# Patient Record
Sex: Female | Born: 1940 | Race: Black or African American | Hispanic: No | Marital: Single | Smoking: Former smoker
Health system: Southern US, Community
[De-identification: ages and names within clinical notes are randomized; demographics above are authoritative.]

## PROBLEM LIST (undated history)

## (undated) DIAGNOSIS — Z87891 Personal history of nicotine dependence: Secondary | ICD-10-CM

## (undated) DIAGNOSIS — E785 Hyperlipidemia, unspecified: Secondary | ICD-10-CM

## (undated) DIAGNOSIS — I5022 Chronic systolic (congestive) heart failure: Secondary | ICD-10-CM

## (undated) DIAGNOSIS — I2510 Atherosclerotic heart disease of native coronary artery without angina pectoris: Secondary | ICD-10-CM

## (undated) DIAGNOSIS — I10 Essential (primary) hypertension: Secondary | ICD-10-CM

## (undated) DIAGNOSIS — Z720 Tobacco use: Secondary | ICD-10-CM

## (undated) HISTORY — DX: Personal history of nicotine dependence: Z87.891

## (undated) HISTORY — DX: Hyperlipidemia, unspecified: E78.5

## (undated) HISTORY — DX: Chronic systolic (congestive) heart failure: I50.22

## (undated) HISTORY — DX: Atherosclerotic heart disease of native coronary artery without angina pectoris: I25.10

## (undated) HISTORY — DX: Essential (primary) hypertension: I10

---

## 1995-10-29 HISTORY — PX: ABDOMINAL HYSTERECTOMY: SHX81

## 2014-06-28 HISTORY — PX: CORONARY ANGIOPLASTY: SHX604

## 2014-06-28 HISTORY — PX: CARDIAC CATHETERIZATION: SHX172

## 2014-07-11 ENCOUNTER — Encounter (HOSPITAL_COMMUNITY)
Admission: EM | Disposition: A | Discharge status: Home / Self Care | Payer: Medicare Other | Attending: Cardiovascular Disease

## 2014-07-11 ENCOUNTER — Inpatient Hospital Stay (HOSPITAL_COMMUNITY)
Admission: EM | Admit: 2014-07-11 | Discharge: 2014-07-14 | DRG: 247 | Disposition: A | Discharge status: Home / Self Care | Payer: Medicare Other | Source: Intra-hospital | Attending: Cardiovascular Disease | Admitting: Cardiovascular Disease

## 2014-07-11 ENCOUNTER — Emergency Department: Payer: Self-pay | Admitting: Emergency Medicine

## 2014-07-11 ENCOUNTER — Encounter (HOSPITAL_COMMUNITY): Payer: Self-pay | Admitting: Physician Assistant

## 2014-07-11 DIAGNOSIS — I2582 Chronic total occlusion of coronary artery: Secondary | ICD-10-CM | POA: Diagnosis present

## 2014-07-11 DIAGNOSIS — F17200 Nicotine dependence, unspecified, uncomplicated: Secondary | ICD-10-CM | POA: Diagnosis present

## 2014-07-11 DIAGNOSIS — I472 Ventricular tachycardia, unspecified: Secondary | ICD-10-CM | POA: Diagnosis not present

## 2014-07-11 DIAGNOSIS — I2119 ST elevation (STEMI) myocardial infarction involving other coronary artery of inferior wall: Secondary | ICD-10-CM | POA: Diagnosis not present

## 2014-07-11 DIAGNOSIS — E785 Hyperlipidemia, unspecified: Secondary | ICD-10-CM | POA: Diagnosis present

## 2014-07-11 DIAGNOSIS — I4729 Other ventricular tachycardia: Secondary | ICD-10-CM | POA: Diagnosis not present

## 2014-07-11 DIAGNOSIS — I2109 ST elevation (STEMI) myocardial infarction involving other coronary artery of anterior wall: Principal | ICD-10-CM | POA: Diagnosis present

## 2014-07-11 DIAGNOSIS — Z7982 Long term (current) use of aspirin: Secondary | ICD-10-CM | POA: Diagnosis not present

## 2014-07-11 DIAGNOSIS — Z7902 Long term (current) use of antithrombotics/antiplatelets: Secondary | ICD-10-CM | POA: Diagnosis not present

## 2014-07-11 DIAGNOSIS — Z79899 Other long term (current) drug therapy: Secondary | ICD-10-CM | POA: Diagnosis not present

## 2014-07-11 DIAGNOSIS — I219 Acute myocardial infarction, unspecified: Secondary | ICD-10-CM | POA: Diagnosis not present

## 2014-07-11 DIAGNOSIS — E669 Obesity, unspecified: Secondary | ICD-10-CM | POA: Diagnosis present

## 2014-07-11 DIAGNOSIS — R000 Tachycardia, unspecified: Secondary | ICD-10-CM | POA: Diagnosis not present

## 2014-07-11 DIAGNOSIS — I213 ST elevation (STEMI) myocardial infarction of unspecified site: Secondary | ICD-10-CM | POA: Diagnosis present

## 2014-07-11 DIAGNOSIS — I2510 Atherosclerotic heart disease of native coronary artery without angina pectoris: Secondary | ICD-10-CM

## 2014-07-11 DIAGNOSIS — R0789 Other chest pain: Secondary | ICD-10-CM | POA: Diagnosis not present

## 2014-07-11 DIAGNOSIS — K3189 Other diseases of stomach and duodenum: Secondary | ICD-10-CM | POA: Diagnosis not present

## 2014-07-11 DIAGNOSIS — R079 Chest pain, unspecified: Secondary | ICD-10-CM | POA: Diagnosis not present

## 2014-07-11 DIAGNOSIS — Z720 Tobacco use: Secondary | ICD-10-CM | POA: Diagnosis present

## 2014-07-11 DIAGNOSIS — I517 Cardiomegaly: Secondary | ICD-10-CM | POA: Diagnosis not present

## 2014-07-11 DIAGNOSIS — I519 Heart disease, unspecified: Secondary | ICD-10-CM

## 2014-07-11 DIAGNOSIS — J42 Unspecified chronic bronchitis: Secondary | ICD-10-CM | POA: Diagnosis not present

## 2014-07-11 HISTORY — DX: Tobacco use: Z72.0

## 2014-07-11 HISTORY — PX: LEFT HEART CATHETERIZATION WITH CORONARY ANGIOGRAM: SHX5451

## 2014-07-11 LAB — COMPREHENSIVE METABOLIC PANEL
ALBUMIN: 3.9 g/dL (ref 3.4–5.0)
ALBUMIN: 3.5 g/dL (ref 3.5–5.2)
ALK PHOS: 153 U/L — AB
ALT: 16 U/L (ref 0–35)
ANION GAP: 7 (ref 7–16)
AST: 39 U/L — AB (ref 0–37)
Alkaline Phosphatase: 125 U/L — ABNORMAL HIGH (ref 39–117)
Anion gap: 16 — ABNORMAL HIGH (ref 5–15)
BUN: 13 mg/dL (ref 7–18)
BUN: 11 mg/dL (ref 6–23)
Bilirubin,Total: 0.3 mg/dL (ref 0.2–1.0)
CO2: 21 mEq/L (ref 19–32)
CREATININE: 0.92 mg/dL (ref 0.60–1.30)
CREATININE: 0.66 mg/dL (ref 0.50–1.10)
Calcium, Total: 9.4 mg/dL (ref 8.5–10.1)
Calcium: 8.8 mg/dL (ref 8.4–10.5)
Chloride: 103 mmol/L (ref 98–107)
Chloride: 99 mEq/L (ref 96–112)
Co2: 26 mmol/L (ref 21–32)
EGFR (Non-African Amer.): >60
GFR calc Af Amer: >90 mL/min (ref >90)
GFR calc non Af Amer: 87 mL/min — ABNORMAL LOW (ref >90)
GLUCOSE: 131 mg/dL — AB (ref 65–99)
Glucose, Bld: 132 mg/dL — ABNORMAL HIGH (ref 70–99)
Osmolality: 274 (ref 275–301)
Potassium: 3.2 mmol/L — ABNORMAL LOW (ref 3.5–5.1)
Potassium: 3.6 mEq/L — ABNORMAL LOW (ref 3.7–5.3)
SGOT(AST): 26 U/L (ref 15–37)
SGPT (ALT): 23 U/L
Sodium: 136 mmol/L (ref 136–145)
Sodium: 136 mEq/L — ABNORMAL LOW (ref 137–147)
Total Bilirubin: 0.2 mg/dL — ABNORMAL LOW (ref 0.3–1.2)
Total Protein: 8.1 g/dL (ref 6.4–8.2)
Total Protein: 6.8 g/dL (ref 6.0–8.3)

## 2014-07-11 LAB — LIPID PANEL
CHOL/HDL RATIO: 4.2 ratio
Cholesterol: 215 mg/dL — ABNORMAL HIGH (ref 0–200)
HDL: 51 mg/dL (ref >39)
LDL Cholesterol: 150 mg/dL — ABNORMAL HIGH (ref 0–99)
Triglycerides: 70 mg/dL (ref <150)
VLDL: 14 mg/dL (ref 0–40)

## 2014-07-11 LAB — TROPONIN I
Troponin I: 2.87 ng/mL (ref <0.30)
Troponin-I: 0.67 ng/mL — ABNORMAL HIGH

## 2014-07-11 LAB — CBC
HCT: 42.7 % (ref 35.0–47.0)
HEMATOCRIT: 36.6 % (ref 36.0–46.0)
HGB: 14 g/dL (ref 12.0–16.0)
Hemoglobin: 12.2 g/dL (ref 12.0–15.0)
MCH: 27.2 pg (ref 26.0–34.0)
MCH: 27.4 pg (ref 26.0–34.0)
MCHC: 32.7 g/dL (ref 32.0–36.0)
MCHC: 33.3 g/dL (ref 30.0–36.0)
MCV: 83 fL (ref 80–100)
MCV: 82.2 fL (ref 78.0–100.0)
Platelet: 407 10*3/uL (ref 150–440)
Platelets: 346 10*3/uL (ref 150–400)
RBC: 5.13 10*6/uL (ref 3.80–5.20)
RBC: 4.45 MIL/uL (ref 3.87–5.11)
RDW: 15.7 % — ABNORMAL HIGH (ref 11.5–14.5)
RDW: 15.4 % (ref 11.5–15.5)
WBC: 16.6 10*3/uL — AB (ref 3.6–11.0)
WBC: 11.9 10*3/uL — AB (ref 4.0–10.5)

## 2014-07-11 LAB — CK TOTAL AND CKMB (NOT AT ARMC)
CK, MB: 32.8 ng/mL (ref 0.3–4.0)
CK, Total: 344 U/L — ABNORMAL HIGH
CK-MB: 9.9 ng/mL — AB (ref 0.5–3.6)
Relative Index: 6.9 — ABNORMAL HIGH (ref 0.0–2.5)
Total CK: 472 U/L — ABNORMAL HIGH (ref 7–177)

## 2014-07-11 LAB — APTT
ACTIVATED PTT: 32 s (ref 23.6–35.9)
aPTT: 91 seconds — ABNORMAL HIGH (ref 24–37)

## 2014-07-11 LAB — PROTIME-INR
INR: 1
INR: 1.18 (ref 0.00–1.49)
PROTHROMBIN TIME: 15 s (ref 11.6–15.2)
Prothrombin Time: 13.4 secs (ref 11.5–14.7)

## 2014-07-11 LAB — POCT I-STAT TROPONIN I: Troponin i, poc: 0.82 ng/mL (ref 0.00–0.08)

## 2014-07-11 SURGERY — LEFT HEART CATHETERIZATION WITH CORONARY ANGIOGRAM
Anesthesia: LOCAL

## 2014-07-11 MED ORDER — MORPHINE SULFATE 2 MG/ML IJ SOLN: 1.0000 mg | INTRAMUSCULAR | Status: DC | PRN

## 2014-07-11 MED ORDER — METOPROLOL TARTRATE 12.5 MG HALF TABLET
12.5000 mg | ORAL_TABLET | Freq: Two times a day (BID) | ORAL | Status: DC
Start: 2014-07-11 — End: 2014-07-12
  Administered 2014-07-11: 12.5 mg via ORAL
  Filled 2014-07-11 (×3): qty 1

## 2014-07-11 MED ORDER — TICAGRELOR 90 MG PO TABS
90.0000 mg | ORAL_TABLET | Freq: Two times a day (BID) | ORAL | Status: DC
  Administered 2014-07-12 – 2014-07-14 (×5): 90 mg via ORAL
  Filled 2014-07-11 (×7): qty 1

## 2014-07-11 MED ORDER — BIVALIRUDIN 250 MG IV SOLR
0.2500 mg/kg/h | INTRAVENOUS | Status: AC
  Filled 2014-07-11: qty 250

## 2014-07-11 MED ORDER — TIROFIBAN HCL IV 5 MG/100ML
INTRAVENOUS | Status: AC
  Filled 2014-07-11: qty 100

## 2014-07-11 MED ORDER — ASPIRIN 81 MG PO CHEW
81.0000 mg | CHEWABLE_TABLET | Freq: Every day | ORAL | Status: DC
  Administered 2014-07-12 – 2014-07-14 (×3): 81 mg via ORAL
  Filled 2014-07-11 (×3): qty 1

## 2014-07-11 MED ORDER — HEPARIN (PORCINE) IN NACL 2-0.9 UNIT/ML-% IJ SOLN
INTRAMUSCULAR | Status: AC
  Filled 2014-07-11: qty 500

## 2014-07-11 MED ORDER — ACETAMINOPHEN 325 MG PO TABS: 650.0000 mg | ORAL_TABLET | ORAL | Status: DC | PRN

## 2014-07-11 MED ORDER — LIDOCAINE HCL (PF) 1 % IJ SOLN
INTRAMUSCULAR | Status: AC
  Filled 2014-07-11: qty 30

## 2014-07-11 MED ORDER — MIDAZOLAM HCL 2 MG/2ML IJ SOLN
INTRAMUSCULAR | Status: AC
  Filled 2014-07-11: qty 2

## 2014-07-11 MED ORDER — ASPIRIN EC 81 MG PO TBEC: 81.0000 mg | DELAYED_RELEASE_TABLET | Freq: Every day | ORAL | Status: DC

## 2014-07-11 MED ORDER — ONDANSETRON HCL 4 MG/2ML IJ SOLN
4.0000 mg | Freq: Four times a day (QID) | INTRAMUSCULAR | Status: DC | PRN
  Administered 2014-07-12: 4 mg via INTRAVENOUS

## 2014-07-11 MED ORDER — BIVALIRUDIN 250 MG IV SOLR
INTRAVENOUS | Status: AC
  Filled 2014-07-11: qty 250

## 2014-07-11 MED ORDER — FENTANYL CITRATE 0.05 MG/ML IJ SOLN
INTRAMUSCULAR | Status: AC
  Filled 2014-07-11: qty 2

## 2014-07-11 MED ORDER — HEPARIN (PORCINE) IN NACL 2-0.9 UNIT/ML-% IJ SOLN
INTRAMUSCULAR | Status: AC
  Filled 2014-07-11: qty 1000

## 2014-07-11 MED ORDER — ATORVASTATIN CALCIUM 80 MG PO TABS
80.0000 mg | ORAL_TABLET | Freq: Every day | ORAL | Status: DC
  Administered 2014-07-11 – 2014-07-13 (×3): 80 mg via ORAL
  Filled 2014-07-11 (×4): qty 1

## 2014-07-11 MED ORDER — SODIUM CHLORIDE 0.9 % IV SOLN: INTRAVENOUS | Status: AC

## 2014-07-11 MED ORDER — VERAPAMIL HCL 2.5 MG/ML IV SOLN
INTRAVENOUS | Status: AC
  Filled 2014-07-11: qty 2

## 2014-07-11 MED ORDER — TICAGRELOR 90 MG PO TABS
ORAL_TABLET | ORAL | Status: AC
  Administered 2014-07-12: 90 mg via ORAL
  Filled 2014-07-11: qty 2

## 2014-07-11 MED ORDER — ONDANSETRON HCL 4 MG/2ML IJ SOLN
4.0000 mg | Freq: Four times a day (QID) | INTRAMUSCULAR | Status: DC | PRN
Start: 2014-07-11 — End: 2014-07-14
  Filled 2014-07-11: qty 2

## 2014-07-11 MED ORDER — ATORVASTATIN CALCIUM 40 MG PO TABS: 40.0000 mg | ORAL_TABLET | Freq: Every day | ORAL | Status: DC

## 2014-07-11 MED ORDER — NITROGLYCERIN 0.4 MG SL SUBL: 0.4000 mg | SUBLINGUAL_TABLET | SUBLINGUAL | Status: DC | PRN

## 2014-07-11 NOTE — H&P (Signed)
     Jamie Gillespie is an 73 y.o. female.   Chief Complaint: Chest pain HPI:  Patient is a 73 year old obese female with a history of tobacco abuse and unknown family history. Her parents passed when she was young. The patient reports the chest pain, which she thought was indigestion, at approximately 1600 hours today. She decided to get it evaluated at the emergency room and was found to have ST elevation or EKG.  The patient currently denies nausea, vomiting, fever, shortness of breath, orthopnea, dizziness, PND, cough, congestion, abdominal pain, hematochezia, melena, lower extremity edema, claudication.   Past Medical History  Diagnosis Date  . Tobacco abuse     No past surgical history on file.  Family History  Problem Relation Age of Onset  . Family history unknown: Yes   Social History:  reports that she has been smoking.  She does not have any smokeless tobacco history on file. She reports that she drinks alcohol. Her drug history is not on file.  Allergies: Not on File  No prescriptions prior to admission    No results found for this or any previous visit (from the past 48 hour(s)). No results found.  Review of Systems  Constitutional: Negative for fever and diaphoresis.  HENT: Negative for congestion.   Respiratory: Negative for cough and shortness of breath.   Cardiovascular: Positive for chest pain. Negative for orthopnea, leg swelling and PND.  Gastrointestinal: Negative for nausea, vomiting, abdominal pain and blood in stool.  Musculoskeletal: Negative for myalgias.  All other systems reviewed and are negative.   There were no vitals taken for this visit. Physical Exam  Nursing note and vitals reviewed. Constitutional: She is oriented to person, place, and time. She appears well-developed.  Obese   HENT:  Head: Normocephalic and atraumatic.  Eyes: EOM are normal. Pupils are equal, round, and reactive to light. No scleral icterus.  Neck: Normal range of  motion. Neck supple. No JVD present.  Cardiovascular: Normal rate, regular rhythm, S1 normal and S2 normal.   No murmur heard. Pulses:      Carotid pulses are on the right side with bruit.      Radial pulses are 2+ on the right side, and 2+ on the left side.       Dorsalis pedis pulses are 2+ on the right side, and 2+ on the left side.  Respiratory: Effort normal and breath sounds normal. She has no wheezes. She has no rales.  GI: Soft. Bowel sounds are normal. She exhibits no distension. There is no tenderness.  Musculoskeletal: She exhibits no edema.  Lymphadenopathy:    She has no cervical adenopathy.  Neurological: She is alert and oriented to person, place, and time. She exhibits normal muscle tone.  Skin: Skin is warm and dry.  Psychiatric: She has a normal mood and affect.     Assessment/Plan STEMI Tobacco abuse   Plan:  The patient will be taken emergently to the catheterization lab for left heart cath. She will be started on beta blocker, aspirin and statin. We'll cycle troponin, check TSH, lipid panel, A1c.  Smoking cessation counseling will be given.  Depending on results of catheterization we may order 2-D echocardiogram.  Cardiac rehabilitation phase I.    Jamie Gillespie, PAC 07/11/2014, 7:37 PM    Agree with note written by Jones Skene [facility]  Pt admitted with Ant STEMI, taken urgently to cath lab. Exam benign.  Jamie Gillespie 07/12/2014 9:15 AM

## 2014-07-11 NOTE — Progress Notes (Signed)
Responded to code stemi and located pt's family. Stayed w/family and acted as Print production planner for family providing spt and staff presence during cath procedure. Accompanied Dr to family when procedure was complete. Escorted family to [facility] and notified staff that family was in waiting area. Offered to answer questions and provide additional spt. Family was very thankful for presence and Chaplain spt. Family members were very pleasant and appropriately concerned about pt. Will report same to unit Chaplain. Marjory Lies Chaplain  07/11/14 2121  Clinical Encounter Type  Visited With Family

## 2014-07-11 NOTE — ED Notes (Signed)
Pt from [facility] with a STEMI.  Pt reports pain started at 330 and stated that it felt like "indigestion".  STEMI called at the hospital.  Pt given 324 ASA and 4000 unit bolus of Heparin PTA.  Elevation in V1, V2.  Dr. Allyson Sabal at bedside on arrival to room.

## 2014-07-11 NOTE — CV Procedure (Signed)
Jamie Gillespie is a 73 y.o. female    [id number] LOCATION:  FACILITY: [facility]  PHYSICIAN: Nanetta Batty, M.D. August 07, 1941   DATE OF PROCEDURE:  07/11/2014  DATE OF DISCHARGE:     CARDIAC CATHETERIZATION     History obtained from chart review.Jamie Gillespie is a 73 year old divorced African American female mother of 2 children without prior known coronary artery disease. She is on no medications. She developed chest pain approximately 2:00 this afternoon and ultimately was seen in the [facility] emergency room where she was noted to have anterior ST segment elevation. She was here with aspirin, IV heparin and nitroglycerin and was transferred for emergency cardiac catheterization   PROCEDURE DESCRIPTION:   The patient was brought to the second floor [facility] cath lab in the postabsorptive state. She was premedicated with Valium 5 mg by mouth, IV Versed and fentanyl. Her right wrist was prepped and shaved in usual sterile fashion. Xylocaine 1% was used for local anesthesia. A 6 French sheath was inserted into the right radial artery using standard Seldinger technique. The patient received 4000 units  of heparin  intravenously.  5 [place] TIG catheter and pigtail catheters were used for selective coronary angiography and left ventriculography respectively. Omnipaque dye was used for the entirety of the case. Retrograde aortic, left ventricular and pullback pressures were recorded. Patient did receive "radial cocktail" within the SideArm sheath.    HEMODYNAMICS:    AO SYSTOLIC/AO DIASTOLIC: 127/76   LV SYSTOLIC/LV DIASTOLIC: 118/12  ANGIOGRAPHIC RESULTS:   1. Left main; normal  2. LAD; occluded at the first septal perforator, this was the infarct related vessel 3. Left circumflex; nondominant and free of significant disease. There were 2 moderate sized ramus branches that were also free of significant disease.  4. Right coronary artery; large dominant  vessel with 50-60% segmental stenosis in the distal portion. 5. Left ventriculography; RAO left ventriculogram was performed using  25 mL of Visipaque dye at 12 mL/second. The overall LVEF estimated  35-40 %  With wall motion abnormalities notable for anteroapical akinesia  IMPRESSION:Jamie Gillespie is suffering an anterior STEMI. We'll proceed with PCI and stenting using aspirin, Brilenta , and Angiomax as well as a drug-eluting stent.  Procedure description: The patient received Intimax bolus with an ACT of 332. Her total contrast administered the patient was 255 cc. The door to balloon time was measured at 36 minutes using a 6 [place] XB 3.0 LAD guide catheter along with a 0.14/190 cm long Pro water guidewire and a 2 mm x 12 minute long balloon angioplasty was performed. Established antegrade flow. It also revealed a long 90% stenosis in the mid-distal LAD. Following this I stented the proximal LAD with a 2.5 mm x 18 mm long Xience Alpine drug-eluting stent at 16 atmospheres. I postdilated the proximal stent with a 2.75 mm x 12 mm long noncompliant balloon to 18 atmospheres. I used the stent delivery balloon angioplasty the mid-distal segmental lesion. I then stented the mid to distal lesion with a 2.25 x 18 mm long Xience Alpine  drug-eluting stent at 18 atmospheres up (2.4 millimeters).I then gave 200 mcg of intra-arterial nitroglycerin. The final angiographic result with reduction of proximal total occlusion to 0% residual distal segmental 90% to 0% residual. There was diffuse severe apical LAD disease.  Final impression: Successful proximal LAD and mid LAD PCI and stenting using drug-eluting stent. The patient will be treated with Angiomax at reduced dose for  4 hours. I did give  her a bolus of Aggrastat IV because of "hazy appearance" of the proximal LAD lesion that appeared thrombotic on initial balloon inflation. She will be treated with aspirin, Brilenta, beta blocker, statin drugs and ACE  inhibitor. I will obtain a 2-D echocardiogram.  Runell Gess. MD, [facility] 07/11/2014 8:47 PM

## 2014-07-12 DIAGNOSIS — I517 Cardiomegaly: Secondary | ICD-10-CM

## 2014-07-12 LAB — POCT ACTIVATED CLOTTING TIME: Activated Clotting Time: 332 seconds

## 2014-07-12 LAB — POCT I-STAT, CHEM 8
BUN: 10 mg/dL (ref 6–23)
CHLORIDE: 100 meq/L (ref 96–112)
CREATININE: 0.6 mg/dL (ref 0.50–1.10)
Calcium, Ion: 1.17 mmol/L (ref 1.13–1.30)
GLUCOSE: 125 mg/dL — AB (ref 70–99)
HCT: 41 % (ref 36.0–46.0)
Hemoglobin: 13.9 g/dL (ref 12.0–15.0)
POTASSIUM: 3.5 meq/L — AB (ref 3.7–5.3)
Sodium: 134 mEq/L — ABNORMAL LOW (ref 137–147)
TCO2: 21 mmol/L (ref 0–100)

## 2014-07-12 LAB — HEMOGLOBIN A1C
HEMOGLOBIN A1C: 6.1 % — AB (ref <5.7)
HEMOGLOBIN A1C: 6.1 % — AB (ref <5.7)
MEAN PLASMA GLUCOSE: 128 mg/dL — AB (ref <117)
Mean Plasma Glucose: 128 mg/dL — ABNORMAL HIGH (ref <117)

## 2014-07-12 LAB — CBC
HCT: 40.1 % (ref 36.0–46.0)
HEMOGLOBIN: 13.2 g/dL (ref 12.0–15.0)
MCH: 26.3 pg (ref 26.0–34.0)
MCHC: 32.9 g/dL (ref 30.0–36.0)
MCV: 79.9 fL (ref 78.0–100.0)
PLATELETS: 408 10*3/uL — AB (ref 150–400)
RBC: 5.02 MIL/uL (ref 3.87–5.11)
RDW: 15.3 % (ref 11.5–15.5)
WBC: 15.6 10*3/uL — AB (ref 4.0–10.5)

## 2014-07-12 LAB — TROPONIN I
Troponin I: >20 ng/mL (ref <0.30)
Troponin I: >20 ng/mL (ref <0.30)

## 2014-07-12 LAB — MRSA PCR SCREENING: MRSA by PCR: NEGATIVE

## 2014-07-12 LAB — LIPID PANEL
Cholesterol: 224 mg/dL — ABNORMAL HIGH (ref 0–200)
HDL: 54 mg/dL (ref >39)
LDL Cholesterol: 151 mg/dL — ABNORMAL HIGH (ref 0–99)
Total CHOL/HDL Ratio: 4.1 RATIO
Triglycerides: 97 mg/dL (ref <150)
VLDL: 19 mg/dL (ref 0–40)

## 2014-07-12 LAB — BASIC METABOLIC PANEL
Anion gap: 16 — ABNORMAL HIGH (ref 5–15)
BUN: 8 mg/dL (ref 6–23)
CHLORIDE: 102 meq/L (ref 96–112)
CO2: 21 meq/L (ref 19–32)
Calcium: 9.3 mg/dL (ref 8.4–10.5)
Creatinine, Ser: 0.58 mg/dL (ref 0.50–1.10)
GFR calc Af Amer: >90 mL/min (ref >90)
GFR calc non Af Amer: >90 mL/min (ref >90)
GLUCOSE: 126 mg/dL — AB (ref 70–99)
POTASSIUM: 3.9 meq/L (ref 3.7–5.3)
SODIUM: 139 meq/L (ref 137–147)

## 2014-07-12 LAB — TSH: TSH: 2.69 u[IU]/mL (ref 0.350–4.500)

## 2014-07-12 MED ORDER — METOPROLOL TARTRATE 25 MG PO TABS
25.0000 mg | ORAL_TABLET | Freq: Two times a day (BID) | ORAL | Status: DC
  Administered 2014-07-12 – 2014-07-13 (×4): 25 mg via ORAL
  Filled 2014-07-12 (×6): qty 1

## 2014-07-12 MED ORDER — ALUM & MAG HYDROXIDE-SIMETH 200-200-20 MG/5ML PO SUSP
15.0000 mL | Freq: Four times a day (QID) | ORAL | Status: DC | PRN
  Administered 2014-07-12: 30 mL via ORAL
  Filled 2014-07-12: qty 30

## 2014-07-12 MED ORDER — LISINOPRIL 5 MG PO TABS
5.0000 mg | ORAL_TABLET | Freq: Every day | ORAL | Status: DC
  Administered 2014-07-12 – 2014-07-14 (×3): 5 mg via ORAL
  Filled 2014-07-12 (×3): qty 1

## 2014-07-12 MED FILL — Sodium Chloride IV Soln 0.9%: INTRAVENOUS | Qty: 50 | Status: AC

## 2014-07-12 NOTE — Progress Notes (Signed)
Subjective:  POD # 1 Ant STEMI Rx with DES X 2 Prox and Mid LAD. No CP/SOB   Objective:  Temp:  [97.8 F (36.6 C)-98.8 F (37.1 C)] 98.8 F (37.1 C) (09/15 0800) Pulse Rate:  [75-109] 84 (09/15 0800) Resp:  [14-27] 16 (09/15 0800) BP: (81-153)/(35-92) 149/68 mmHg (09/15 0800) SpO2:  [94 %-100 %] 94 % (09/15 0800) Weight:  [152 lb (68.947 kg)-176 lb 9.4 oz (80.1 kg)] 175 lb 11.3 oz (79.7 kg) (09/15 0400) Weight change:   Intake/Output from previous day: 09/14 0701 - 09/15 0700 In: 3300 [P.O.:420; I.V.:594] Out: 2050 [Urine:2050]  Intake/Output from this shift: Total I/O In: 120 [P.O.:120] Out: 350 [Urine:350]  Physical Exam: General appearance: alert and no distress Neck: no adenopathy, no carotid bruit, no JVD, supple, symmetrical, trachea midline and thyroid not enlarged, symmetric, no tenderness/mass/nodules Lungs: clear to auscultation bilaterally Heart: regular rate and rhythm, S1, S2 normal, no murmur, click, rub or gallop Extremities: Bandage RRA puncture site  Lab Results: Results for orders placed during the hospital encounter of 07/11/14 (from the past 48 hour(s))  POCT I-STAT TROPONIN I     Status: Abnormal   Collection Time    07/11/14  7:33 PM      Result Value Ref Range   Troponin i, poc 0.82 (*) 0.00 - 0.08 ng/mL   Comment NOTIFIED PHYSICIAN     Comment 3            Comment: Due to the release kinetics of cTnI,     a negative result within the first hours     of the onset of symptoms does not rule out     myocardial infarction with certainty.     If myocardial infarction is still suspected,     repeat the test at appropriate intervals.  CBC     Status: Abnormal   Collection Time    07/11/14  8:00 PM      Result Value Ref Range   WBC 11.9 (*) 4.0 - 10.5 K/uL   RBC 4.45  3.87 - 5.11 MIL/uL   Hemoglobin 12.2  12.0 - 15.0 g/dL   HCT 36.6  36.0 - 46.0 %   MCV 82.2  78.0 - 100.0 fL   MCH 27.4  26.0 - 34.0 pg   MCHC 33.3  30.0 - 36.0 g/dL   RDW 15.4  11.5 - 15.5 %   Platelets 346  150 - 400 K/uL  COMPREHENSIVE METABOLIC PANEL     Status: Abnormal   Collection Time    07/11/14  8:00 PM      Result Value Ref Range   Sodium 136 (*) 137 - 147 mEq/L   Potassium 3.6 (*) 3.7 - 5.3 mEq/L   Chloride 99  96 - 112 mEq/L   CO2 21  19 - 32 mEq/L   Glucose, Bld 132 (*) 70 - 99 mg/dL   BUN 11  6 - 23 mg/dL   Creatinine, Ser 0.66  0.50 - 1.10 mg/dL   Calcium 8.8  8.4 - 10.5 mg/dL   Total Protein 6.8  6.0 - 8.3 g/dL   Albumin 3.5  3.5 - 5.2 g/dL   AST 39 (*) 0 - 37 U/L   ALT 16  0 - 35 U/L   Alkaline Phosphatase 125 (*) 39 - 117 U/L   Total Bilirubin 0.2 (*) 0.3 - 1.2 mg/dL   GFR calc non Af Amer 87 (*) >90 mL/min   GFR calc Af  Amer >90  >90 mL/min   Comment: (NOTE)     The eGFR has been calculated using the CKD EPI equation.     This calculation has not been validated in all clinical situations.     eGFR's persistently <90 mL/min signify possible Chronic Kidney     Disease.   Anion gap 16 (*) 5 - 15  HEMOGLOBIN A1C     Status: Abnormal   Collection Time    07/11/14  8:00 PM      Result Value Ref Range   Hemoglobin A1C 6.1 (*) <5.7 %   Comment: (NOTE)                                                                               According to the ADA Clinical Practice Recommendations for 2011, when     HbA1c is used as a screening test:      >=6.5%   Diagnostic of Diabetes Mellitus               (if abnormal result is confirmed)     5.7-6.4%   Increased risk of developing Diabetes Mellitus     References:Diagnosis and Classification of Diabetes Mellitus,Diabetes     FMBB,4037,09(UKRCV 1):S62-S69 and Standards of Medical Care in             Diabetes - 2011,Diabetes Care,2011,34 (Suppl 1):S11-S61.   Mean Plasma Glucose 128 (*) <117 mg/dL   Comment: Performed at [facility]     Status: Abnormal   Collection Time    07/11/14  8:00 PM      Result Value Ref Range   Cholesterol 215 (*) 0 - 200 mg/dL    Triglycerides 70  <150 mg/dL   HDL 51  >39 mg/dL   Total CHOL/HDL Ratio 4.2     VLDL 14  0 - 40 mg/dL   LDL Cholesterol 150 (*) 0 - 99 mg/dL   Comment:            Total Cholesterol/HDL:CHD Risk     Coronary Heart Disease Risk Table                         Men   Women      1/2 Average Risk   3.4   3.3      Average Risk       5.0   4.4      2 X Average Risk   9.6   7.1      3 X Average Risk  23.4   11.0                Use the calculated Patient Ratio     above and the CHD Risk Table     to determine the patient's CHD Risk.                ATP III CLASSIFICATION (LDL):      <100     mg/dL   Optimal      100-129  mg/dL   Near or Above  Optimal      130-159  mg/dL   Borderline      160-189  mg/dL   High      >190     mg/dL   Very High  PROTIME-INR     Status: None   Collection Time    07/11/14  8:00 PM      Result Value Ref Range   Prothrombin Time 15.0  11.6 - 15.2 seconds   INR 1.18  0.00 - 1.49  APTT     Status: Abnormal   Collection Time    07/11/14  8:00 PM      Result Value Ref Range   aPTT 91 (*) 24 - 37 seconds   Comment:            IF BASELINE aPTT IS ELEVATED,     SUGGEST PATIENT RISK ASSESSMENT     BE USED TO DETERMINE APPROPRIATE     ANTICOAGULANT THERAPY.  CK TOTAL AND CKMB     Status: Abnormal   Collection Time    07/11/14  8:00 PM      Result Value Ref Range   Total CK 472 (*) 7 - 177 U/L   CK, MB 32.8 (*) 0.3 - 4.0 ng/mL   Comment: CRITICAL RESULT CALLED TO, READ BACK BY AND VERIFIED WITH:     T TOMLISON,RN 2111 07/11/14 WBOND   Relative Index 6.9 (*) 0.0 - 2.5  TROPONIN I     Status: Abnormal   Collection Time    07/11/14  8:00 PM      Result Value Ref Range   Troponin I 2.87 (*) <0.30 ng/mL   Comment:            Due to the release kinetics of cTnI,     a negative result within the first hours     of the onset of symptoms does not rule out     myocardial infarction with certainty.     If myocardial infarction is still  suspected,     repeat the test at appropriate intervals.     CRITICAL RESULT CALLED TO, READ BACK BY AND VERIFIED WITH:     T TOMLISON,RN 2111 07/11/14 WBOND  MRSA PCR SCREENING     Status: None   Collection Time    07/11/14  9:13 PM      Result Value Ref Range   MRSA by PCR NEGATIVE  NEGATIVE   Comment:            The GeneXpert MRSA Assay (FDA     approved for NASAL specimens     only), is one component of a     comprehensive MRSA colonization     surveillance program. It is not     intended to diagnose MRSA     infection nor to guide or     monitor treatment for     MRSA infections.  BASIC METABOLIC PANEL     Status: Abnormal   Collection Time    07/12/14  3:04 AM      Result Value Ref Range   Sodium 139  137 - 147 mEq/L   Potassium 3.9  3.7 - 5.3 mEq/L   Chloride 102  96 - 112 mEq/L   CO2 21  19 - 32 mEq/L   Glucose, Bld 126 (*) 70 - 99 mg/dL   BUN 8  6 - 23 mg/dL   Creatinine, Ser 0.58  0.50 - 1.10 mg/dL   Calcium 9.3  8.4 - 10.5 mg/dL   GFR calc non Af Amer >90  >90 mL/min   GFR calc Af Amer >90  >90 mL/min   Comment: (NOTE)     The eGFR has been calculated using the CKD EPI equation.     This calculation has not been validated in all clinical situations.     eGFR's persistently <90 mL/min signify possible Chronic Kidney     Disease.   Anion gap 16 (*) 5 - 15  CBC     Status: Abnormal   Collection Time    07/12/14  3:04 AM      Result Value Ref Range   WBC 15.6 (*) 4.0 - 10.5 K/uL   RBC 5.02  3.87 - 5.11 MIL/uL   Hemoglobin 13.2  12.0 - 15.0 g/dL   HCT 40.1  36.0 - 46.0 %   MCV 79.9  78.0 - 100.0 fL   MCH 26.3  26.0 - 34.0 pg   MCHC 32.9  30.0 - 36.0 g/dL   RDW 15.3  11.5 - 15.5 %   Platelets 408 (*) 150 - 400 K/uL  TROPONIN I     Status: Abnormal   Collection Time    07/12/14  3:04 AM      Result Value Ref Range   Troponin I >20.00 (*) <0.30 ng/mL   Comment:            Due to the release kinetics of cTnI,     a negative result within the first hours      of the onset of symptoms does not rule out     myocardial infarction with certainty.     If myocardial infarction is still suspected,     repeat the test at appropriate intervals.     CRITICAL VALUE NOTED.  VALUE IS CONSISTENT WITH PREVIOUSLY REPORTED AND CALLED VALUE.  TSH     Status: None   Collection Time    07/12/14  3:04 AM      Result Value Ref Range   TSH 2.690  0.350 - 4.500 uIU/mL  LIPID PANEL     Status: Abnormal   Collection Time    07/12/14  3:04 AM      Result Value Ref Range   Cholesterol 224 (*) 0 - 200 mg/dL   Triglycerides 97  <150 mg/dL   HDL 54  >39 mg/dL   Total CHOL/HDL Ratio 4.1     VLDL 19  0 - 40 mg/dL   LDL Cholesterol 151 (*) 0 - 99 mg/dL   Comment:            Total Cholesterol/HDL:CHD Risk     Coronary Heart Disease Risk Table                         Men   Women      1/2 Average Risk   3.4   3.3      Average Risk       5.0   4.4      2 X Average Risk   9.6   7.1      3 X Average Risk  23.4   11.0                Use the calculated Patient Ratio     above and the CHD Risk Table     to determine the patient's CHD Risk.  ATP III CLASSIFICATION (LDL):      <100     mg/dL   Optimal      100-129  mg/dL   Near or Above                        Optimal      130-159  mg/dL   Borderline      160-189  mg/dL   High      >190     mg/dL   Very High    Imaging: Imaging results have been reviewed  Assessment/Plan:   1. Principal Problem: 2.   STEMI (ST elevation myocardial infarction) 3. Active Problems: 4.   Tobacco abuse 5.   Time Spent Directly with Patient:  15 minutes  Length of Stay:  LOS: 1 day   POD #1 Ant STEMI Rx with DES X 2 Prox and Mid LAD done radially. EF 34% ish . Trop > 20.  Will re check 2D echo. On appropriate meds. Titrate BB, add ACE-I. Smoking cessation. [facility]. Tx TCU. Prob home Thurs. Can get F/U in our [facility] office   Landrey Mahurin J 07/12/2014, 9:16 AM

## 2014-07-12 NOTE — Progress Notes (Signed)
CARDIAC REHAB PHASE I   PRE:  Rate/Rhythm: 86 SR    BP: sitting 138/69    SaO2: 94 RA  MODE:  Ambulation: 350 ft   POST:  Rate/Rhythm: 103 ST    BP: sitting 142/63     SaO2: 96 RA  Tolerated well, no c/o, feels good. Pt is used to walking 2 hrs, 3 days a week. Began ed. Pt motivated to quit smoking and her son plans to quit with her. Interested in [facility] and will send referral to [facility].   [id number] Elissa Lovett [place] CES, ACSM 07/12/2014 2:15 PM

## 2014-07-12 NOTE — Progress Notes (Addendum)
CARE MANAGEMENT NOTE 07/12/2014  Patient:  Jamie Gillespie,Jamie Gillespie   Account Number:  [id number]  Date Initiated:  07/12/2014  Documentation initiated by:  DAVIS,RHONDA  Subjective/Objective Assessment:   POD # 1 Ant STEMI Rx with DES X 2 Prox and Mid LAD. No CP/SOB     Action/Plan:   from home/smokes/lives with adult children/parents died at early ages/no previous known cardiac history   Anticipated DC Date:  07/15/2014   Anticipated DC Plan:  HOME/SELF CARE  In-house referral  Financial Counselor      DC Planning Services  CM consult      [facility] Choice  NA   Choice offered to / List presented to:  NA      DME agency  NA        HH agency  NA   Status of service:  In process, will continue to follow Medicare Important Message given?   (If response is "NO", the following Medicare IM given date fields will be blank) Date Medicare IM given:   Medicare IM given by:   Date Additional Medicare IM given:   Additional Medicare IM given by:    Discharge Disposition:    Per UR Regulation:  Reviewed for med. necessity/level of care/duration of stay  If discussed at Long Length of Stay Meetings, dates discussed:    Comments:  [id number]/Rhonda Davis,RN,BSN,CCM

## 2014-07-12 NOTE — Progress Notes (Signed)
Echocardiogram 2D Echocardiogram has been performed.  Chantrell Apsey 07/12/2014, 1:21 PM

## 2014-07-13 NOTE — Progress Notes (Signed)
[id number] Cardiac Rehab Pt has walked in hall today denies any cp or SOB. Completed discharge education with pt. She voices understanding. Beatrix Fetters, RN 07/13/2014 3:37 PM

## 2014-07-13 NOTE — Progress Notes (Signed)
Subjective:  No CP/SOB, POD # 2 Anterior STEMI  Objective:  Temp:  [97.8 F (36.6 C)-98.5 F (36.9 C)] 98.1 F (36.7 C) (09/16 0832) Pulse Rate:  [73-100] 79 (09/16 0832) Resp:  [18-20] 20 (09/15 1538) BP: (126-145)/(60-95) 140/60 mmHg (09/16 0832) SpO2:  [95 %-98 %] 97 % (09/16 0832) Weight:  [175 lb 14.8 oz (79.8 kg)] 175 lb 14.8 oz (79.8 kg) (09/16 0417) Weight change: 23 lb 14.8 oz (10.853 kg)  Intake/Output from previous day: 09/15 0701 - 09/16 0700 In: 1200 [P.O.:1200] Out: 2250 [Urine:2250]  Intake/Output from this shift: Total I/O In: 300 [P.O.:300] Out: -   Physical Exam: General appearance: alert and no distress Neck: no adenopathy, no carotid bruit, no JVD, supple, symmetrical, trachea midline and thyroid not enlarged, symmetric, no tenderness/mass/nodules Lungs: clear to auscultation bilaterally Heart: regular rate and rhythm, S1, S2 normal, no murmur, click, rub or gallop Extremities: extremities normal, atraumatic, no cyanosis or edema  Lab Results: Results for orders placed during the hospital encounter of 07/11/14 (from the past 48 hour(s))  POCT I-STAT TROPONIN I     Status: Abnormal   Collection Time    07/11/14  7:33 PM      Result Value Ref Range   Troponin i, poc 0.82 (*) 0.00 - 0.08 ng/mL   Comment NOTIFIED PHYSICIAN     Comment 3            Comment: Due to the release kinetics of cTnI,     a negative result within the first hours     of the onset of symptoms does not rule out     myocardial infarction with certainty.     If myocardial infarction is still suspected,     repeat the test at appropriate intervals.  POCT I-STAT, CHEM 8     Status: Abnormal   Collection Time    07/11/14  7:52 PM      Result Value Ref Range   Sodium 134 (*) 137 - 147 mEq/L   Potassium 3.5 (*) 3.7 - 5.3 mEq/L   Chloride 100  96 - 112 mEq/L   BUN 10  6 - 23 mg/dL   Creatinine, Ser 0.60  0.50 - 1.10 mg/dL   Glucose, Bld 125 (*) 70 - 99 mg/dL   Calcium,  Ion 1.17  1.13 - 1.30 mmol/L   TCO2 21  0 - 100 mmol/L   Hemoglobin 13.9  12.0 - 15.0 g/dL   HCT 41.0  36.0 - 46.0 %  CBC     Status: Abnormal   Collection Time    07/11/14  8:00 PM      Result Value Ref Range   WBC 11.9 (*) 4.0 - 10.5 K/uL   RBC 4.45  3.87 - 5.11 MIL/uL   Hemoglobin 12.2  12.0 - 15.0 g/dL   HCT 36.6  36.0 - 46.0 %   MCV 82.2  78.0 - 100.0 fL   MCH 27.4  26.0 - 34.0 pg   MCHC 33.3  30.0 - 36.0 g/dL   RDW 15.4  11.5 - 15.5 %   Platelets 346  150 - 400 K/uL  COMPREHENSIVE METABOLIC PANEL     Status: Abnormal   Collection Time    07/11/14  8:00 PM      Result Value Ref Range   Sodium 136 (*) 137 - 147 mEq/L   Potassium 3.6 (*) 3.7 - 5.3 mEq/L   Chloride 99  96 - 112 mEq/L  CO2 21  19 - 32 mEq/L   Glucose, Bld 132 (*) 70 - 99 mg/dL   BUN 11  6 - 23 mg/dL   Creatinine, Ser 0.66  0.50 - 1.10 mg/dL   Calcium 8.8  8.4 - 10.5 mg/dL   Total Protein 6.8  6.0 - 8.3 g/dL   Albumin 3.5  3.5 - 5.2 g/dL   AST 39 (*) 0 - 37 U/L   ALT 16  0 - 35 U/L   Alkaline Phosphatase 125 (*) 39 - 117 U/L   Total Bilirubin 0.2 (*) 0.3 - 1.2 mg/dL   GFR calc non Af Amer 87 (*) >90 mL/min   GFR calc Af Amer >90  >90 mL/min   Comment: (NOTE)     The eGFR has been calculated using the CKD EPI equation.     This calculation has not been validated in all clinical situations.     eGFR's persistently <90 mL/min signify possible Chronic Kidney     Disease.   Anion gap 16 (*) 5 - 15  HEMOGLOBIN A1C     Status: Abnormal   Collection Time    07/11/14  8:00 PM      Result Value Ref Range   Hemoglobin A1C 6.1 (*) <5.7 %   Comment: (NOTE)                                                                               According to the ADA Clinical Practice Recommendations for 2011, when     HbA1c is used as a screening test:      >=6.5%   Diagnostic of Diabetes Mellitus               (if abnormal result is confirmed)     5.7-6.4%   Increased risk of developing Diabetes Mellitus      References:Diagnosis and Classification of Diabetes Mellitus,Diabetes     SWHQ,7591,63(WGYKZ 1):S62-S69 and Standards of Medical Care in             Diabetes - 2011,Diabetes Care,2011,34 (Suppl 1):S11-S61.   Mean Plasma Glucose 128 (*) <117 mg/dL   Comment: Performed at [place]     Status: Abnormal   Collection Time    07/11/14  8:00 PM      Result Value Ref Range   Cholesterol 215 (*) 0 - 200 mg/dL   Triglycerides 70  <150 mg/dL   HDL 51  >39 mg/dL   Total CHOL/HDL Ratio 4.2     VLDL 14  0 - 40 mg/dL   LDL Cholesterol 150 (*) 0 - 99 mg/dL   Comment:            Total Cholesterol/HDL:CHD Risk     Coronary Heart Disease Risk Table                         Men   Women      1/2 Average Risk   3.4   3.3      Average Risk       5.0   4.4      2 X Average Risk   9.6  7.1      3 X Average Risk  23.4   11.0                Use the calculated Patient Ratio     above and the CHD Risk Table     to determine the patient's CHD Risk.                ATP III CLASSIFICATION (LDL):      <100     mg/dL   Optimal      100-129  mg/dL   Near or Above                        Optimal      130-159  mg/dL   Borderline      160-189  mg/dL   High      >190     mg/dL   Very High  PROTIME-INR     Status: None   Collection Time    07/11/14  8:00 PM      Result Value Ref Range   Prothrombin Time 15.0  11.6 - 15.2 seconds   INR 1.18  0.00 - 1.49  APTT     Status: Abnormal   Collection Time    07/11/14  8:00 PM      Result Value Ref Range   aPTT 91 (*) 24 - 37 seconds   Comment:            IF BASELINE aPTT IS ELEVATED,     SUGGEST PATIENT RISK ASSESSMENT     BE USED TO DETERMINE APPROPRIATE     ANTICOAGULANT THERAPY.  CK TOTAL AND CKMB     Status: Abnormal   Collection Time    07/11/14  8:00 PM      Result Value Ref Range   Total CK 472 (*) 7 - 177 U/L   CK, MB 32.8 (*) 0.3 - 4.0 ng/mL   Comment: CRITICAL RESULT CALLED TO, READ BACK BY AND VERIFIED WITH:     T  TOMLISON,RN 2111 07/11/14 WBOND   Relative Index 6.9 (*) 0.0 - 2.5  TROPONIN I     Status: Abnormal   Collection Time    07/11/14  8:00 PM      Result Value Ref Range   Troponin I 2.87 (*) <0.30 ng/mL   Comment:            Due to the release kinetics of cTnI,     a negative result within the first hours     of the onset of symptoms does not rule out     myocardial infarction with certainty.     If myocardial infarction is still suspected,     repeat the test at appropriate intervals.     CRITICAL RESULT CALLED TO, READ BACK BY AND VERIFIED WITH:     T TOMLISON,RN 2111 07/11/14 WBOND  POCT ACTIVATED CLOTTING TIME     Status: None   Collection Time    07/11/14  8:06 PM      Result Value Ref Range   Activated Clotting Time 332    MRSA PCR SCREENING     Status: None   Collection Time    07/11/14  9:13 PM      Result Value Ref Range   MRSA by PCR NEGATIVE  NEGATIVE   Comment:            The GeneXpert MRSA Assay (FDA  approved for NASAL specimens     only), is one component of a     comprehensive MRSA colonization     surveillance program. It is not     intended to diagnose MRSA     infection nor to guide or     monitor treatment for     MRSA infections.  BASIC METABOLIC PANEL     Status: Abnormal   Collection Time    07/12/14  3:04 AM      Result Value Ref Range   Sodium 139  137 - 147 mEq/L   Potassium 3.9  3.7 - 5.3 mEq/L   Chloride 102  96 - 112 mEq/L   CO2 21  19 - 32 mEq/L   Glucose, Bld 126 (*) 70 - 99 mg/dL   BUN 8  6 - 23 mg/dL   Creatinine, Ser 0.58  0.50 - 1.10 mg/dL   Calcium 9.3  8.4 - 10.5 mg/dL   GFR calc non Af Amer >90  >90 mL/min   GFR calc Af Amer >90  >90 mL/min   Comment: (NOTE)     The eGFR has been calculated using the CKD EPI equation.     This calculation has not been validated in all clinical situations.     eGFR's persistently <90 mL/min signify possible Chronic Kidney     Disease.   Anion gap 16 (*) 5 - 15  CBC     Status: Abnormal    Collection Time    07/12/14  3:04 AM      Result Value Ref Range   WBC 15.6 (*) 4.0 - 10.5 K/uL   RBC 5.02  3.87 - 5.11 MIL/uL   Hemoglobin 13.2  12.0 - 15.0 g/dL   HCT 40.1  36.0 - 46.0 %   MCV 79.9  78.0 - 100.0 fL   MCH 26.3  26.0 - 34.0 pg   MCHC 32.9  30.0 - 36.0 g/dL   RDW 15.3  11.5 - 15.5 %   Platelets 408 (*) 150 - 400 K/uL  TROPONIN I     Status: Abnormal   Collection Time    07/12/14  3:04 AM      Result Value Ref Range   Troponin I >20.00 (*) <0.30 ng/mL   Comment:            Due to the release kinetics of cTnI,     a negative result within the first hours     of the onset of symptoms does not rule out     myocardial infarction with certainty.     If myocardial infarction is still suspected,     repeat the test at appropriate intervals.     CRITICAL VALUE NOTED.  VALUE IS CONSISTENT WITH PREVIOUSLY REPORTED AND CALLED VALUE.  HEMOGLOBIN A1C     Status: Abnormal   Collection Time    07/12/14  3:04 AM      Result Value Ref Range   Hemoglobin A1C 6.1 (*) <5.7 %   Comment: (NOTE)                                                                               According  to the ADA Clinical Practice Recommendations for 2011, when     HbA1c is used as a screening test:      >=6.5%   Diagnostic of Diabetes Mellitus               (if abnormal result is confirmed)     5.7-6.4%   Increased risk of developing Diabetes Mellitus     References:Diagnosis and Classification of Diabetes Mellitus,Diabetes     XTKW,4097,35(HGDJM 1):S62-S69 and Standards of Medical Care in             Diabetes - 2011,Diabetes EQAS,3419,62 (Suppl 1):S11-S61.   Mean Plasma Glucose 128 (*) <117 mg/dL   Comment: Performed at [facility]  [facility]     Status: None   Collection Time    07/12/14  3:04 AM      Result Value Ref Range   TSH 2.690  0.350 - 4.500 uIU/mL  LIPID PANEL     Status: Abnormal   Collection Time    07/12/14  3:04 AM      Result Value Ref Range   Cholesterol 224 (*) 0 - 200  mg/dL   Triglycerides 97  <150 mg/dL   HDL 54  >39 mg/dL   Total CHOL/HDL Ratio 4.1     VLDL 19  0 - 40 mg/dL   LDL Cholesterol 151 (*) 0 - 99 mg/dL   Comment:            Total Cholesterol/HDL:CHD Risk     Coronary Heart Disease Risk Table                         Men   Women      1/2 Average Risk   3.4   3.3      Average Risk       5.0   4.4      2 X Average Risk   9.6   7.1      3 X Average Risk  23.4   11.0                Use the calculated Patient Ratio     above and the CHD Risk Table     to determine the patient's CHD Risk.                ATP III CLASSIFICATION (LDL):      <100     mg/dL   Optimal      100-129  mg/dL   Near or Above                        Optimal      130-159  mg/dL   Borderline      160-189  mg/dL   High      >190     mg/dL   Very High  TROPONIN I     Status: Abnormal   Collection Time    07/12/14  8:48 AM      Result Value Ref Range   Troponin I >20.00 (*) <0.30 ng/mL   Comment:            Due to the release kinetics of cTnI,     a negative result within the first hours     of the onset of symptoms does not rule out     myocardial infarction with certainty.     If myocardial infarction is still suspected,  repeat the test at appropriate intervals.     CRITICAL VALUE NOTED.  VALUE IS CONSISTENT WITH PREVIOUSLY REPORTED AND CALLED VALUE.  TROPONIN I     Status: Abnormal   Collection Time    07/12/14  3:00 PM      Result Value Ref Range   Troponin I >20.00 (*) <0.30 ng/mL   Comment:            Due to the release kinetics of cTnI,     a negative result within the first hours     of the onset of symptoms does not rule out     myocardial infarction with certainty.     If myocardial infarction is still suspected,     repeat the test at appropriate intervals.     CRITICAL VALUE NOTED.  VALUE IS CONSISTENT WITH PREVIOUSLY REPORTED AND CALLED VALUE.    Imaging: Imaging results have been reviewed  Assessment/Plan:   1. Principal Problem: 2.    STEMI (ST elevation myocardial infarction) 3. Active Problems: 4.   Tobacco abuse 5.   Time Spent Directly with Patient:  20 minutes  Length of Stay:  LOS: 2 days   POD #2 Anterior STEMI Rx with PCI/Stent prox and mid LAD with DES performed radially. Looks great !!! No CP. 2D shows EF 35-40% with anterior WMA. On approp meds (DAPT). Can probably titrate BB and ACE-I. [facility]. Tx tele. Home AM. ROV with [facility] in our [place] office (more convenient for patient).  Lorretta Harp 07/13/2014, 10:02 AM

## 2014-07-14 DIAGNOSIS — I519 Heart disease, unspecified: Secondary | ICD-10-CM

## 2014-07-14 DIAGNOSIS — E785 Hyperlipidemia, unspecified: Secondary | ICD-10-CM

## 2014-07-14 MED ORDER — NITROGLYCERIN 0.4 MG SL SUBL: 0.4000 mg | SUBLINGUAL_TABLET | SUBLINGUAL | Status: DC | PRN

## 2014-07-14 MED ORDER — LISINOPRIL 5 MG PO TABS: 5.0000 mg | ORAL_TABLET | Freq: Every day | ORAL | Status: DC

## 2014-07-14 MED ORDER — CARVEDILOL 6.25 MG PO TABS: 6.2500 mg | ORAL_TABLET | Freq: Two times a day (BID) | ORAL | Status: DC

## 2014-07-14 MED ORDER — ATORVASTATIN CALCIUM 80 MG PO TABS: 80.0000 mg | ORAL_TABLET | Freq: Every day | ORAL | Status: DC

## 2014-07-14 MED ORDER — ASPIRIN 81 MG PO CHEW: 81.0000 mg | CHEWABLE_TABLET | Freq: Every day | ORAL | Status: AC

## 2014-07-14 MED ORDER — TICAGRELOR 90 MG PO TABS: 90.0000 mg | ORAL_TABLET | Freq: Two times a day (BID) | ORAL | Status: DC

## 2014-07-14 MED ORDER — CARVEDILOL 6.25 MG PO TABS
6.2500 mg | ORAL_TABLET | Freq: Two times a day (BID) | ORAL | Status: DC
  Filled 2014-07-14 (×2): qty 1

## 2014-07-14 NOTE — Progress Notes (Signed)
Patient Profile: 73 y/o AAF smoker w/o any prior cardiac history, admitted 07/11/14 for anterior STEMI.   Subjective: Doing great. No further complaints. Denies any recurrent CP. No dyspnea, orthopnea/PND. Ambulating without difficulty. No palpitations, dizziness, syncope/ near syncope.   Objective: Vital signs in last 24 hours: Temp:  [98.1 F (36.7 C)-98.7 F (37.1 C)] 98.6 F (37 C) (09/17 0508) Pulse Rate:  [66-85] 85 (09/17 0508) Resp:  [18-20] 18 (09/17 0508) BP: (106-140)/(60-84) 134/84 mmHg (09/17 0508) SpO2:  [97 %-99 %] 98 % (09/17 0508) Weight:  [171 lb 8.3 oz (77.8 kg)] 171 lb 8.3 oz (77.8 kg) (09/17 0500) Last BM Date: 07/13/14  Intake/Output from previous day: 09/16 0701 - 09/17 0700 In: 300 [P.O.:300] Out: -  Intake/Output this shift:    Medications Current Facility-Administered Medications  Medication Dose Route Frequency Provider Last Rate Last Dose  . acetaminophen (TYLENOL) tablet 650 mg  650 mg Oral Q4H PRN Wilburt Finlay, PA-C      . alum & mag hydroxide-simeth (MAALOX/MYLANTA) 200-200-20 MG/5ML suspension 15-30 mL  15-30 mL Oral Q6H PRN Runell Gess, MD   30 mL at 07/12/14 0051  . aspirin chewable tablet 81 mg  81 mg Oral Daily Runell Gess, MD   81 mg at 07/13/14 2820  . atorvastatin (LIPITOR) tablet 80 mg  80 mg Oral q1800 Runell Gess, MD   80 mg at 07/13/14 1655  . lisinopril (PRINIVIL,ZESTRIL) tablet 5 mg  5 mg Oral Daily Runell Gess, MD   5 mg at 07/13/14 0906  . metoprolol tartrate (LOPRESSOR) tablet 25 mg  25 mg Oral BID Runell Gess, MD   25 mg at 07/13/14 2155  . morphine 2 MG/ML injection 1 mg  1 mg Intravenous Q1H PRN Runell Gess, MD      . nitroGLYCERIN (NITROSTAT) SL tablet 0.4 mg  0.4 mg Sublingual Q5 Min x 3 PRN Wilburt Finlay, PA-C      . ondansetron [facility]) injection 4 mg  4 mg Intravenous Q6H PRN Runell Gess, MD      . ticagrelor [facility]) tablet 90 mg  90 mg Oral BID Runell Gess, MD   90 mg at 07/13/14  2155    PE: General appearance: alert, cooperative and no distress Neck: no carotid bruit and no JVD Lungs: clear to auscultation bilaterally Heart: regular rate and rhythm, S1, S2 normal, no murmur, click, rub or gallop Extremities: no LEE Pulses: 2+ and symmetric Skin: warm and dry Neurologic: Grossly normal  Lab Results:   Recent Labs  07/11/14 1952 07/11/14 2000 07/12/14 0304  WBC  --  11.9* 15.6*  HGB 13.9 12.2 13.2  HCT 41.0 36.6 40.1  PLT  --  346 408*   BMET  Recent Labs  07/11/14 1952 07/11/14 2000 07/12/14 0304  NA 134* 136* 139  K 3.5* 3.6* 3.9  CL 100 99 102  CO2  --  21 21  GLUCOSE 125* 132* 126*  BUN 10 11 8   CREATININE 0.60 0.66 0.58  CALCIUM  --  8.8 9.3   PT/INR  Recent Labs  07/11/14 2000  LABPROT 15.0  INR 1.18   Cholesterol  Recent Labs  07/12/14 0304  CHOL 224*   Cardiac Panel (last 3 results)  Recent Labs  07/11/14 2000 07/12/14 0304 07/12/14 0848 07/12/14 1500  CKTOTAL 472*  --   --   --   CKMB 32.8*  --   --   --   TROPONINI 2.87* >  20.00* >20.00* >20.00*  RELINDX 6.9*  --   --   --    Lipid Panel     Component Value Date/Time   CHOL 224* 07/12/2014 0304   TRIG 97 07/12/2014 0304   HDL 54 07/12/2014 0304   CHOLHDL 4.1 07/12/2014 0304   VLDL 19 07/12/2014 0304   LDLCALC 151* 07/12/2014 0304     Studies/Results: 2D echo 07/12/14  Study Conclusions  - Left ventricle: The cavity size was normal. Wall thickness was increased in a pattern of moderate LVH. Systolic function was moderately reduced. The estimated ejection fraction was in the range of 35% to 40%. Akinesis of the entireanteroseptal myocardium. Features are consistent with a pseudonormal left ventricular filling pattern, with concomitant abnormal relaxation and increased filling pressure (grade 2 diastolic dysfunction).   Assessment/Plan  Principal Problem:   STEMI -anterior s/p PCI + DES to proximal and mid LAD 07/11/14 Active Problems:   Tobacco  abuse   HLD (hyperlipidemia)   Systolic dysfunction -EF 35-40% on echo 07/12/14  1. Anterior STEMI: S/P emergent PCI + DES to proximal and mid LAD. No recurrent CP. Continue DAPT with ASA + Brilinta for a minimum of 1 year. On BB, high dose statin and ACE. Focus on secondary prevention: lifestyle modification, smoking cessation, lipid lowering, OP cardiac rehab.  2. Systolic HF: Post PCI echo demonstrated moderately reduced LVF with EF at 35-40%. No dyspnea. Appear euvolemic on physical exam. NSVT on telemetry overnight. Currently in NSR with controlled HR and BP. She is on an ACE and BB. However, ? changing BB from metoprolol tartrate to either metoprolol succinate or carvedilol. May need to add a low dose diuretic as an OP. Low sodium diet and daily weights.   3. HLD: LDL elevated at 151. LDL goal is at least <100, < 70 more ideal. Continue high dose statin therapy. Repeat fasting lipid and hepatic function panel as an OP in 4-6 weeks.   4. Tobacco Abuse: Smoking cessation strongly advised. Pt expresses desire to quit.   Dispo: Home today. F/u in 1-2 weeks. Pt lives in [place] and is requesting f/u in [place].      LOS: 3 days    Brittainy M. Sharol Harness, PA-C 07/14/2014 8:20 AM  I have seen and examined the patient along with Brittainy M. Sharol Harness, PA-C.  I have reviewed the chart, notes and new data.  I agree with PA's note.  Key new complaints: feels well, no angina or dyspnea Key examination changes: no clinical HF, no arrhythmia Key new findings / data: depressed LVEF is probably combination of stunning and permanent injury; switch to carvedilol  PLAN: She is certain she will quit smoking and so will the rest of her family. Discussed mandatory DAPT. Discussed sodium restriction, daily weight monitoring and signs of HF exacerbation. Switch beta blocker to carvedilol. DC home. Early f/u  Thurmon Fair, MD, [facility] [phone] 07/14/2014,  9:42 AM

## 2014-07-14 NOTE — Progress Notes (Signed)
Patient discharged via wheelchair with belongings and accompanied by [facility] nursing student Marianna Fuss) and Mrs. Bettger daughter.

## 2014-07-14 NOTE — Care Management Note (Signed)
    Page 1 of 2   07/14/2014     4:44:59 PM CARE MANAGEMENT NOTE 07/14/2014  Patient:  Jamie Gillespie,Jamie Gillespie   Account Number:  [id number]  Date Initiated:  07/12/2014  Documentation initiated by:  DAVIS,RHONDA  Subjective/Objective Assessment:   POD # 1 Ant STEMI Rx with DES X 2 Prox and Mid LAD. No CP/SOB     Action/Plan:   from home/smokes/lives with adult children/parents died at early ages/no previous known cardiac history   Anticipated DC Date:  07/15/2014   Anticipated DC Plan:  HOME/SELF CARE  In-house referral  Financial Counselor      DC Planning Services  CM consult  Medication Assistance  MATCH Program  [facility]igent [facility]      [facility] Choice  NA   Choice offered to / List presented to:  NA      DME agency  NA        [facility] agency  NA   Status of service:  Completed, signed off Medicare Important Message given?   (If response is "NO", the following Medicare IM given date fields will be blank) Date Medicare IM given:   Medicare IM given by:   Date Additional Medicare IM given:   Additional Medicare IM given by:    Discharge Disposition:  HOME/SELF CARE  Per UR Regulation:  Reviewed for med. necessity/level of care/duration of stay  If discussed at Long Length of Stay Meetings, dates discussed:    Comments:  07/14/14 Sidney Ace, RN, BSN [phone] Pt states she has Medicare, but no part D benefits.  Pt has free 30 day trial card for Brilinta.  She is eligible for med assistance through [facility] program.  Match letter given with explanation of program benefits.  Pt has no PCP, and is agreeable to follow up at [facility]--appt made for Monday, Sept 21 at 9:00am.  Pt will also meet this visit with phamacist Cathy at [facility] to enroll in [place] patient assistance program.  Pt aware to bring financial documentation required for program enrollment.  [id number] Davis,RN,BSN,CCM

## 2014-07-14 NOTE — Discharge Summary (Signed)
Physician Discharge Summary  Patient ID: Jamie Gillespie MRN: [id number] DOB/AGE: 12-12-1940 73 y.o.  Admit date: 07/11/2014 Discharge date: 07/14/2014  Admission Diagnoses: Anterior STEMI  Discharge Diagnoses:  Principal Problem:   STEMI -anterior s/p PCI + DES to proximal and mid LAD 07/11/14 Active Problems:   Tobacco abuse   HLD (hyperlipidemia)   Systolic dysfunction -EF 35-40% on echo 07/12/14   Discharged Condition: stable  Hospital Course: The patient is a 73 y/o AA female smoker, with no prior cardiac history who presented to [facility] on 07/11/14 with chest pain. EKG demonstrated anterior ST elevations. Code STEMI was activated and she was transported urgently to the [facility] cath lab for emergent PCI. The procedure was performed by Dr. Allyson Sabal. Access was obtained via the right radial artery. She was found to have an occluded LAD at the first septal perforator, felt to be the infarct related vessel. This was successfully treated with PCI utilizing DES. She required stenting to both the proximal and mid portions. Door to balloon time was 36 min. The left main was normal. The left circumflex was nondominant and free of significant disease. There were 2 moderate sized ramus branches that were also free of significant disease. The right coronary artery is a large dominant vessel with 50-60% segmental stenosis in the distal portion. Left ventriculography demonstrated moderately reduced EF at 35-40%. Wall motion abnormalities were notable for anteroapical akinesia. She tolerated the procedure well. She was loaded on DAPT with ASA + Brilinta. She left the cath lab in stable condition. She was transferred to the CCU. In addition to DAPT, she was placed on 6.25 mg of Coreg BID, 5 mg of lisinopril and 80 mg of Lipitor. A lipid panel revealed and elevated LDL of 151 mg/dL. She was counseled on smoking cessation. She had no post cath complications and no recurrent chest pain. She had no  difficulties with phase I cardiac rehab. She had no arhythmias on telemetry. A post PCI echo was performed and systolic function estimate was consistent with LHC, demonstrating an EF of 35-40%. Grade 2 diastolic dysfunction was also noted. Her volume status remained stable and she did not require any lasix. She was counseled on importance of daily weights and low sodium diet.   On hospital day 3, she was seen and examined by Dr. Royann Shivers, who determined she was stable for discharge home. She is scheduled for post-hospital follow-up with Eula Listen, PA-C, in [place] on 07/25/14. She was provided a free 30 day supply of Brilinta. PRN SL NTG was also prescribed.    Consults: None  Significant Diagnostic Studies:   Emergent [facility] 07/11/14 HEMODYNAMICS:  AO SYSTOLIC/AO DIASTOLIC: 127/76  LV SYSTOLIC/LV DIASTOLIC: 118/12  ANGIOGRAPHIC RESULTS:  1. Left main; normal  2. LAD; occluded at the first septal perforator, this was the infarct related vessel  3. Left circumflex; nondominant and free of significant disease. There were 2 moderate sized ramus branches that were also free of significant disease.  4. Right coronary artery; large dominant vessel with 50-60% segmental stenosis in the distal portion.  5. Left ventriculography; RAO left ventriculogram was performed using  25 mL of Visipaque dye at 12 mL/second. The overall LVEF estimated  35-40 % With wall motion abnormalities notable for anteroapical akinesia    2D echo 07/12/14 Study Conclusions  - Left ventricle: The cavity size was normal. Wall thickness was increased in a pattern of moderate LVH. Systolic function was moderately reduced. The estimated ejection fraction was in the range of  35% to 40%. Akinesis of the entireanteroseptal myocardium. Features are consistent with a pseudonormal left ventricular filling pattern, with concomitant abnormal relaxation and increased filling pressure (grade 2 diastolic dysfunction).   Treatments:  See Hospital Course  Discharge Exam: Blood pressure 137/74, pulse 82, temperature 97.9 F (36.6 C), temperature source Oral, resp. rate 19, height  (1.575 m), weight 171 lb 8.3 oz (77.8 kg), SpO2 99.00%.   Disposition: Final discharge disposition not confirmed      Discharge Instructions   Amb Referral to Cardiac Rehabilitation    Complete by:  As directed   To [place]     Diet - low sodium heart healthy    Complete by:  As directed      Increase activity slowly    Complete by:  As directed             Medication List         acetaminophen 325 MG tablet  Commonly known as:  TYLENOL  Take 325 mg by mouth every 6 (six) hours as needed for mild pain or moderate pain.     aspirin 81 MG chewable tablet  Chew 1 tablet (81 mg total) by mouth daily.     atorvastatin 80 MG tablet  Commonly known as:  LIPITOR  Take 1 tablet (80 mg total) by mouth daily at 6 PM.     carvedilol 6.25 MG tablet  Commonly known as:  COREG  Take 1 tablet (6.25 mg total) by mouth 2 (two) times daily with a meal.     lisinopril 5 MG tablet  Commonly known as:  PRINIVIL,ZESTRIL  Take 1 tablet (5 mg total) by mouth daily.     nitroGLYCERIN 0.4 MG SL tablet  Commonly known as:  NITROSTAT  Place 1 tablet (0.4 mg total) under the tongue every 5 (five) minutes x 3 doses as needed for chest pain.     ticagrelor 90 MG Tabs tablet  Commonly known as:  BRILINTA  Take 1 tablet (90 mg total) by mouth 2 (two) times daily.     ticagrelor 90 MG Tabs tablet  Commonly known as:  BRILINTA  Take 1 tablet (90 mg total) by mouth 2 (two) times daily.       Follow-up Information   Follow up with DUNN,RYAN, PA-C On 07/25/2014. (1:15 pm Cardiology follow-up appointment)    Specialty:  Physician Assistant   Contact information:   [address] [place] [address] [phone]       Follow up with [facility]     On 07/18/2014. (9:00 am; please bring photo  ID, all medications you are taking and dc paperwork with you to appt.)    Contact information:   [address] [place] [address] [phone]      Follow up with [facility]    . (11:15 meet with pharmacist )    Contact information:   [address] [place] [address] [phone]     TIME SPENT ON DISCHARGE, INCLUDING PHYSICIAN TIME: >30 MINUTES  Signed: Robbie Lis 07/14/2014, 12:13 PM

## 2014-07-14 NOTE — Progress Notes (Signed)
Patient teaching and discharge instructions were given to Mrs. Niebel and her daughter by [facility] nursing student) and his instructor. Instructions were given on the [facility] program, the [facility] prescription assistance program, details of follow up with Cardiology. Further instructions of the effects of medications and the dosing regimens were explained and given in a written format as well to Mrs. Holtmeyer and her daughter. [facility] pharmacy in [place] was called and specifically asked about the acceptance of the [facility] voucher for Mrs. Rosencrantz' Brilinta and pharmacist Karalee Height. stated that they would accept voucher.

## 2014-07-18 ENCOUNTER — Ambulatory Visit: Payer: Medicare Other | Attending: Internal Medicine

## 2014-07-18 ENCOUNTER — Ambulatory Visit: Payer: Medicare Other | Attending: Family Medicine | Admitting: Family Medicine

## 2014-07-18 ENCOUNTER — Encounter: Payer: Self-pay | Admitting: Family Medicine

## 2014-07-18 VITALS — BP 140/80 | HR 71 | Temp 98.2°F | Resp 18 | Ht 63.0 in | Wt 171.8 lb

## 2014-07-18 DIAGNOSIS — I519 Heart disease, unspecified: Secondary | ICD-10-CM

## 2014-07-18 DIAGNOSIS — F17200 Nicotine dependence, unspecified, uncomplicated: Secondary | ICD-10-CM | POA: Diagnosis not present

## 2014-07-18 DIAGNOSIS — Z87891 Personal history of nicotine dependence: Secondary | ICD-10-CM | POA: Diagnosis not present

## 2014-07-18 DIAGNOSIS — I5020 Unspecified systolic (congestive) heart failure: Secondary | ICD-10-CM | POA: Insufficient documentation

## 2014-07-18 DIAGNOSIS — I219 Acute myocardial infarction, unspecified: Secondary | ICD-10-CM | POA: Diagnosis not present

## 2014-07-18 DIAGNOSIS — Z0000 Encounter for general adult medical examination without abnormal findings: Secondary | ICD-10-CM | POA: Diagnosis not present

## 2014-07-18 DIAGNOSIS — I213 ST elevation (STEMI) myocardial infarction of unspecified site: Secondary | ICD-10-CM

## 2014-07-18 DIAGNOSIS — Z9861 Coronary angioplasty status: Secondary | ICD-10-CM | POA: Insufficient documentation

## 2014-07-18 DIAGNOSIS — I509 Heart failure, unspecified: Secondary | ICD-10-CM | POA: Diagnosis not present

## 2014-07-18 DIAGNOSIS — Z720 Tobacco use: Secondary | ICD-10-CM

## 2014-07-18 MED ORDER — ZOSTER VACCINE LIVE 19400 UNT/0.65ML ~~LOC~~ SOLR: 0.6500 mL | Freq: Once | SUBCUTANEOUS | Status: DC

## 2014-07-18 NOTE — Assessment & Plan Note (Signed)
A: stable  Meds: compliant P: Continue current regimen

## 2014-07-18 NOTE — Progress Notes (Signed)
   Subjective:    Patient ID: Jamie Gillespie, female    DOB: [birth year], 73 y.o.   MRN: [id number] CC: establish care, HFU for STEMI, systolic CHF EF 42-45% HPI 73 year old female presents to establish care and hospital followup of STEMI and systolic CHF with ejection fraction 35-40%.  1. status post STEMI: Patient is status post STEMI with cardiac cath and stent placement on 07/11/2014. Since discharge from the hospital 07/14/2014. She's compliant with all medications. She reports some diarrhea after taking Lipitor. She denies recurrent chest pain, dyspnea on exertion, orthopnea, lower extremity edema. She and her daughter have quit smoking.  2. systolic CHF: Patient compliant with medications. Review of systems as per above.   3. smoking: Patient has quit   4. HM: due for mammogram, zostavax, colonoscopy   Review of Systems As per history of present illness    Objective:   Physical Exam BP 140/80  Pulse 71  Temp(Src) 98.2 F (36.8 C) (Oral)  Resp 18  Ht 5\' 3"  (1.6 m)  Wt 171 lb 12.8 oz (77.928 kg)  BMI 30.44 kg/m2  SpO2 100% Wt Readings from Last 3 Encounters:  07/18/14 171 lb 12.8 oz (77.928 kg)  07/14/14 171 lb 8.3 oz (77.8 kg)  07/14/14 171 lb 8.3 oz (77.8 kg)  General appearance: alert, cooperative and no distress Neck: no adenopathy, supple, symmetrical, trachea midline and thyroid not enlarged, symmetric, no tenderness/mass/nodules Lungs: clear to auscultation bilaterally Heart: regular rate and rhythm, S1, S2 normal, no murmur, click, rub or gallop Extremities: extremities normal, atraumatic, no cyanosis or edema       Assessment & Plan:

## 2014-07-18 NOTE — Assessment & Plan Note (Signed)
A: patient has quit smoking P: Congratulated patient F/u status q visit

## 2014-07-18 NOTE — Assessment & Plan Note (Signed)
A: compensated Meds: compliant, tolerating P: F/u q visit, titrate up on BB as tolerated

## 2014-07-18 NOTE — Progress Notes (Signed)
Establish Care HFU MI feeling good right now

## 2014-07-18 NOTE — Assessment & Plan Note (Signed)
A: due for zostavax, colonoscopy, mammogram P: zostavax rx given Mammogram habdout given Will discuss colonoscopy at f/u physical

## 2014-07-18 NOTE — Patient Instructions (Addendum)
Jamie Gillespie,  Thank you for coming in today. It was a pleasure meeting you. I look forward to being your primary doctor.  1. Regarding heart disease and heart failure: Excellent job with quit smoking! Keep up the good work. This is the best thing you can do for you heart and overall health.  Please increase coreg to 12.5 mg twice daily   2. Health care maintenance recommendations: screening mammogram.  zostavax to prevent shingles.    Keep your follow up with your cardiologist. Plan to see me in 1-2 months for health maintenance physical, sooner if needed.   Dr. Armen Pickup

## 2014-07-25 ENCOUNTER — Encounter: Payer: Self-pay | Admitting: Physician Assistant

## 2014-07-25 NOTE — Progress Notes (Signed)
Patient rescheduled appt.   This encounter was created in error - please disregard. 

## 2014-07-27 ENCOUNTER — Encounter: Payer: Self-pay | Admitting: Physician Assistant

## 2014-07-27 ENCOUNTER — Ambulatory Visit (INDEPENDENT_AMBULATORY_CARE_PROVIDER_SITE_OTHER): Payer: Medicare Other | Admitting: Physician Assistant

## 2014-07-27 VITALS — BP 152/92 | HR 69 | Ht 63.0 in | Wt 172.8 lb

## 2014-07-27 DIAGNOSIS — I219 Acute myocardial infarction, unspecified: Secondary | ICD-10-CM | POA: Diagnosis not present

## 2014-07-27 DIAGNOSIS — Z87891 Personal history of nicotine dependence: Secondary | ICD-10-CM

## 2014-07-27 DIAGNOSIS — I25810 Atherosclerosis of coronary artery bypass graft(s) without angina pectoris: Secondary | ICD-10-CM

## 2014-07-27 DIAGNOSIS — I213 ST elevation (STEMI) myocardial infarction of unspecified site: Secondary | ICD-10-CM

## 2014-07-27 DIAGNOSIS — E785 Hyperlipidemia, unspecified: Secondary | ICD-10-CM | POA: Diagnosis not present

## 2014-07-27 DIAGNOSIS — I10 Essential (primary) hypertension: Secondary | ICD-10-CM | POA: Diagnosis not present

## 2014-07-27 MED ORDER — LISINOPRIL 10 MG PO TABS: 10.0000 mg | ORAL_TABLET | Freq: Every day | ORAL | Status: DC

## 2014-07-27 NOTE — Patient Instructions (Signed)
Your physician has recommended you make the following change in your medication:  Increase Lisinopril 10 mg once daily   Please record blood pressure for 1 week and call with or drop off results   Your physician recommends that you return for lab work in:  Fasting labs the last week of October   Your physician recommends that you schedule a follow-up appointment in:  3 months with Dr. Kirke Corin or Dr. Mariah Milling

## 2014-07-27 NOTE — Progress Notes (Signed)
Patient Name: Jamie GrovesClara Sponsel, DOB 03-Jul-1941, MRN [id number]  Date of Encounter: 07/27/2014  Primary Care Provider:  Lora PaulaFUNCHES, JOSALYN C, MD Primary Cardiologist:  Dr. Mariah MillingGollan, MD  Patient Profile:  73 y.o. female with history of tobacco abuse who is here for hospital follow up after [facility] for anterior STEMI s/p PCI/DES to pLAD & mLAD.  Problem List:   Past Medical History  Diagnosis Date  . Tobacco abuse   . Myocardial infarction 07/11/2014  . Chronic systolic CHF (congestive heart failure)     a. echo 07/12/14: EF 35-40%, mod LVH, AK of entire anteroseptal myocardium, GR2DD  . CAD (coronary artery disease)     a. cath 06/2014: LM nl, LAD occulded @ first septal perforator s/p PCI/DES pLAD &mLAD, LCx no sig dz, RCA 50-60%,    Past Surgical History  Procedure Laterality Date  . Abdominal hysterectomy  1997  . Cardiac catheterization  06/2014  . Coronary angioplasty  06/2014    a. cath 06/2014: LM nl, LAD occulded @ first septal perforator s/p PCI/DES pLAD &mLAD, LCx no sig dz, RCA 50-60%,      Allergies:  No Known Allergies   HPI:  73 y.o. female with the above problem list who is here for hospital follow up after [facility] for anterior STEMI s/p PCI/DES to pLAD &mLAD.   Patient without any known prior significant cardiac history or known family history as her parents unfortunately passed away when she was young. She reported having developed chest pain, which she thought was indigestion, about 3 hours prior to seeking medical care. [facility] she was found to have ST elevation on EKG with i-Stat troponin of 0.82. She denied any nausea, vomiting, fever, shortness of breath, orthopnea, dizziness, PND, cough, congestion, abdominal pain, hematochezia, melena, lower extremity edema, claudication. She was taken emergently to the cath lab which revealed: LM nl, LAD occluded at the first septal perforator (this was the  infarct related vessel) s/p PCI/Xience Alpine DES to pLAD & mLAD, LCx nondominant and free of disease, RCA large vessel with 50-60% segmental stenosis in the distal portion, LVEF 35-40% with WMA abnormalities notable for anteroseptal akinesia. She received Angiomax at a reduced dose for 4 hours. She did receive a bolus of Aggrastat IV 2/2 "hazy appearance" of the pLAD lesion that appeared thrombotic on initial balloon inflation. She was treated with Brilinta, b-blocker, statin, asa, ACEi. 2-D echo on 9/15 revealed EF 35-40%, GR2DD, moderate LVH, &Akinesis of the entire anteroseptal myocardium. TnI trended to greater than 20. She did well throughout the remainder of her admission.   She comes in today feeling well. No issues since her discharge. She has been advancing her activity somewhat as tolerated without issues. She would like to know when she can sweep her kitchen. She is taking all of her medication daily without issues. She has quit smoking and is saving 100 dollars per month. She feels better not smoking. No chest pain, palpitations, SOB/DOE, nausea, vomiting, edema., orthopnea, presyncope, or syncope.    Home Medications:  Prior to Admission medications   Medication Sig Start Date End Date Taking? Authorizing Provider  acetaminophen (TYLENOL) 325 MG tablet Take 325 mg by mouth every 6 (six) hours as needed for mild pain or moderate pain.   Yes Historical Provider, MD  aspirin 81 MG chewable tablet Chew 1 tablet (81 mg total) by mouth daily. 07/14/14  Yes Brittainy Sherlynn CarbonM Simmons, PA-C  atorvastatin (  LIPITOR) 80 MG tablet Take 1 tablet (80 mg total) by mouth daily at 6 PM. 07/14/14  Yes Brittainy Sherlynn Carbon, PA-C  carvedilol (COREG) 6.25 MG tablet Take 1 tablet (6.25 mg total) by mouth 2 (two) times daily with a meal. 07/14/14  Yes Brittainy Sherlynn Carbon, PA-C  lisinopril (PRINIVIL,ZESTRIL) 5 MG tablet Take 1 tablet (5 mg total) by mouth daily. 07/14/14  Yes Brittainy Sherlynn Carbon, PA-C  nitroGLYCERIN  (NITROSTAT) 0.4 MG SL tablet Place 1 tablet (0.4 mg total) under the tongue every 5 (five) minutes x 3 doses as needed for chest pain. 07/14/14  Yes Brittainy Sherlynn Carbon, PA-C  ticagrelor (BRILINTA) 90 MG TABS tablet Take 1 tablet (90 mg total) by mouth 2 (two) times daily. 07/14/14  Yes Brittainy Sherlynn Carbon, PA-C  zoster vaccine live, PF, (ZOSTAVAX) 80034 UNT/0.65ML injection Inject 19,400 Units into the skin once. 07/18/14  Yes Lora Paula, MD     Weights: Filed Weights   07/27/14 1442  Weight: 172 lb 12 oz (78.359 kg)     Review of Systems:  All other systems reviewed and are otherwise negative except as noted above.  Physical Exam:  Blood pressure 152/92, pulse 69, height 5\' 3"  (1.6 m), weight 172 lb 12 oz (78.359 kg).  General: Pleasant, NAD Psych: Normal affect. Neuro: Alert and oriented X 3. Moves all extremities spontaneously. HEENT: Normal  Neck: Supple without bruits or JVD. Lungs:  Resp regular and unlabored, CTA. Heart: RRR no s3, s4, or murmurs. Abdomen: Soft, non-tender, non-distended, BS + x 4.  Extremities: No clubbing, cyanosis or edema. DP/PT/Radials 2+ and equal bilaterally.   Accessory Clinical Findings:  EKG - NSR, 69, old anterior TWI  Assessment & Plan:  1. CAD s/p PCI/DES to pLAD &mLAD:  -Continue DAPT (aspirin 81 mg daily and Brilinta 90 mg bid) for at least 12 months  -Continue Coreg 6.25 mg bid, Lipitor 80 mg daily & SL NTG 0.4 mg PRN  -Increased lisinopril to 10 mg daily -[facility]   2. Chronic systolic CHF:  -EF 35-40% by echo 07/12/2014  -Euvolemic today -Daily weights, low sodium diet   3. HTN: -BP elevated today, patient reports she is anxious  -Will increase lisinopril to 10 mg in attempt to get to target dosing -Continue Coreg 6.25 mg bid -Low sodium diet -Check BP at home and call with report in 7-10 days   3. HLD:  -TC 224, LDL 151, HDL 54, TG 97  -Lipitor 80 mg daily, goal LDL <70  -Recheck FLP and HFP end of October 2015   4.  Tobacco abuse:  -She reports that she has quit smoking -[place]  Eula Listen, PA-C [facility] [address] [place], [place] [address] [phone] [facility] 07/27/2014, 3:01 PM

## 2014-08-22 ENCOUNTER — Other Ambulatory Visit (INDEPENDENT_AMBULATORY_CARE_PROVIDER_SITE_OTHER): Payer: Medicare Other

## 2014-08-22 DIAGNOSIS — E785 Hyperlipidemia, unspecified: Secondary | ICD-10-CM | POA: Diagnosis not present

## 2014-08-23 LAB — LIPID PANEL
CHOL/HDL RATIO: 2.5 ratio (ref 0.0–4.4)
Cholesterol, Total: 139 mg/dL (ref 100–199)
HDL: 55 mg/dL (ref >39)
LDL Calculated: 66 mg/dL (ref 0–99)
Triglycerides: 88 mg/dL (ref 0–149)
VLDL Cholesterol Cal: 18 mg/dL (ref 5–40)

## 2014-08-23 LAB — HEPATIC FUNCTION PANEL
ALBUMIN: 4.2 g/dL (ref 3.5–4.8)
ALT: 14 IU/L (ref 0–32)
AST: 20 IU/L (ref 0–40)
Alkaline Phosphatase: 145 IU/L — ABNORMAL HIGH (ref 39–117)
BILIRUBIN DIRECT: 0.13 mg/dL (ref 0.00–0.40)
BILIRUBIN TOTAL: 0.5 mg/dL (ref 0.0–1.2)
Total Protein: 6.7 g/dL (ref 6.0–8.5)

## 2014-09-07 NOTE — Progress Notes (Signed)
LVM @ 1111

## 2014-09-13 ENCOUNTER — Encounter: Payer: Self-pay | Admitting: Physician Assistant

## 2014-09-13 DIAGNOSIS — I5022 Chronic systolic (congestive) heart failure: Secondary | ICD-10-CM | POA: Insufficient documentation

## 2014-09-13 DIAGNOSIS — I2510 Atherosclerotic heart disease of native coronary artery without angina pectoris: Secondary | ICD-10-CM | POA: Insufficient documentation

## 2014-10-06 ENCOUNTER — Encounter (HOSPITAL_COMMUNITY): Payer: Self-pay | Admitting: Cardiovascular Disease

## 2015-10-19 ENCOUNTER — Telehealth: Payer: Self-pay | Admitting: Physician Assistant

## 2015-10-19 ENCOUNTER — Other Ambulatory Visit: Payer: Self-pay | Admitting: *Deleted

## 2015-10-19 MED ORDER — TICAGRELOR 90 MG PO TABS: 90.0000 mg | ORAL_TABLET | Freq: Two times a day (BID) | ORAL | Status: DC

## 2015-10-19 NOTE — Telephone Encounter (Signed)
°*  STAT* If patient is at the pharmacy, call can be transferred to refill team.   1. Which medications need to be refilled? (please list name of each medication and dose if known)  brilinta 90 mg x 2 daily   2. Which pharmacy/location (including street and city if local pharmacy) is medication to be sent to? [facility] [address] [place]   3. Do they need a 30 day or 90 day supply? 90  PATIENT IS OUT AND HAS NOT TAKEN IN 1 WEEK

## 2015-10-19 NOTE — Telephone Encounter (Signed)
Brilinta 90 mg sent to local pharmacy for refill. Pt must keep appointment with Dr. Mariah Milling due for 1 yr f/u.

## 2015-11-08 ENCOUNTER — Ambulatory Visit: Payer: Medicare Other | Admitting: Cardiovascular Disease

## 2015-12-04 ENCOUNTER — Ambulatory Visit: Payer: Medicare Other | Admitting: Cardiovascular Disease

## 2016-01-15 ENCOUNTER — Ambulatory Visit: Payer: Medicare Other | Admitting: Cardiovascular Disease

## 2016-01-15 ENCOUNTER — Telehealth: Payer: Self-pay | Admitting: Cardiovascular Disease

## 2016-01-15 NOTE — Telephone Encounter (Signed)
°*  STAT* If patient is at the pharmacy, call can be transferred to refill team.   1. Which medications need to be refilled? (please list name of each medication and dose if known)  Brilinta   2. Which pharmacy/location (including street and city if local pharmacy) is medication to be sent to? [facility] in [place]   3. Do they need a 30 day or 90 day supply? 90 day

## 2016-01-16 MED ORDER — TICAGRELOR 90 MG PO TABS: 90.0000 mg | ORAL_TABLET | Freq: Two times a day (BID) | ORAL | Status: DC

## 2016-01-16 NOTE — Telephone Encounter (Signed)
Refill sent for Brilinta

## 2016-03-06 ENCOUNTER — Ambulatory Visit: Payer: Medicare Other | Admitting: Cardiovascular Disease

## 2016-04-15 ENCOUNTER — Encounter: Payer: Self-pay | Admitting: Cardiovascular Disease

## 2016-04-15 ENCOUNTER — Ambulatory Visit (INDEPENDENT_AMBULATORY_CARE_PROVIDER_SITE_OTHER): Payer: Medicare Other | Admitting: Cardiovascular Disease

## 2016-04-15 VITALS — BP 170/100 | HR 97 | Ht 63.0 in | Wt 174.8 lb

## 2016-04-15 DIAGNOSIS — I2510 Atherosclerotic heart disease of native coronary artery without angina pectoris: Secondary | ICD-10-CM

## 2016-04-15 DIAGNOSIS — I10 Essential (primary) hypertension: Secondary | ICD-10-CM

## 2016-04-15 DIAGNOSIS — Z720 Tobacco use: Secondary | ICD-10-CM

## 2016-04-15 DIAGNOSIS — I5022 Chronic systolic (congestive) heart failure: Secondary | ICD-10-CM

## 2016-04-15 DIAGNOSIS — I213 ST elevation (STEMI) myocardial infarction of unspecified site: Secondary | ICD-10-CM

## 2016-04-15 DIAGNOSIS — E785 Hyperlipidemia, unspecified: Secondary | ICD-10-CM

## 2016-04-15 MED ORDER — LISINOPRIL 10 MG PO TABS: 10.0000 mg | ORAL_TABLET | Freq: Every day | ORAL | Status: DC

## 2016-04-15 MED ORDER — ROSUVASTATIN CALCIUM 40 MG PO TABS: 40.0000 mg | ORAL_TABLET | Freq: Every day | ORAL | Status: DC

## 2016-04-15 MED ORDER — CARVEDILOL 6.25 MG PO TABS: 6.2500 mg | ORAL_TABLET | Freq: Two times a day (BID) | ORAL | Status: DC

## 2016-04-15 MED ORDER — TICAGRELOR 90 MG PO TABS: 90.0000 mg | ORAL_TABLET | Freq: Two times a day (BID) | ORAL | Status: DC

## 2016-04-15 NOTE — Patient Instructions (Signed)
You are doing well. No medication changes were made.  Please monitor your blood pressure Call the office if it runs >150  Please call us if you have new issues that need to be addressed before your next appt.  Your physician wants you to follow-up in: 6 months.  You will receive a reminder letter in the mail two months in advance. If you don't receive a letter, please call our office to schedule the follow-up appointment.

## 2016-04-15 NOTE — Progress Notes (Signed)
Patient ID: Jamie Gillespie, female   DOB: 04/12/1941, 75 y.o.   MRN: [id number] Cardiology Office Note  Date:  04/15/2016   ID:  Jamie Gillespie, DOB 1941/04/15, MRN [id number]  PCP:  Lora Paula, MD   Chief Complaint  Patient presents with  . Follow-up    patient states that her cholesterol medicine atorvastatin makes her stomach uoset and makes her hurt. no chest pain, sob or swelling and no other complaints.    HPI:  75 y.o. female with history of tobacco abuse, Admission to the hospital 9/14-9/17/16 for anterior STEMI s/p PCI/DES to pLAD & mLAD, who presents today for routine follow-up of her coronary artery disease  In follow-up today, she is not been taking her blood pressure medications for approximately one week, prescription ran out. Blood pressure elevated on today's visit She reports that she feels well, denies any symptoms concerning for angina No chest pain, palpitations, SOB/DOE, nausea, vomiting, edema., orthopnea, presyncope, or syncope.  Reports that the Lipitor is upsetting her stomach and would like to change to an alternate cholesterol medication Stop smoking at the time of her heart attack, Denies having any diabetes, overall feels well with no complaints She has been taking her aspirin and Brilinta faithfully  EKG on today's visit shows normal sinus rhythm with rate 97 bpm, no significant ST or T-wave changes  Other past medical history At the time of her STEMI,TnI trended to greater than 20.  cath lab revealed: LM nl, LAD occluded at the first septal perforator (this was the infarct related vessel) s/p PCI/Xience Alpine DES to pLAD & mLAD, LCx nondominant and free of disease, RCA large vessel with 50-60% segmental stenosis in the distal portion, LVEF 35-40% with WMA abnormalities notable for anteroseptal akinesia.    2-D echo on 9/15 revealed EF 35-40%, GR2DD, moderate LVH, &Akinesis of the entire anteroseptal myocardium.    PMH:   has a past medical history  of Tobacco abuse; Chronic systolic CHF (congestive heart failure) (HCC); and CAD (coronary artery disease).  PSH:    Past Surgical History  Procedure Laterality Date  . Abdominal hysterectomy  1997  . Cardiac catheterization  06/2014  . Coronary angioplasty  06/2014    a. cath 06/2014: LM nl, LAD occulded @ first septal perforator s/p PCI/DES pLAD &mLAD, LCx no sig dz, RCA 50-60%,   . Left heart catheterization with coronary angiogram N/A 07/11/2014    Procedure: LEFT HEART CATHETERIZATION WITH CORONARY ANGIOGRAM;  Surgeon: Runell Gess, MD;  Location: [facility] CATH LAB;  Service: Cardiovascular;  Laterality: N/A;    Current Outpatient Prescriptions  Medication Sig Dispense Refill  . acetaminophen (TYLENOL) 325 MG tablet Take 325 mg by mouth every 6 (six) hours as needed for mild pain or moderate pain.    Marland Kitchen aspirin 81 MG chewable tablet Chew 1 tablet (81 mg total) by mouth daily.    . carvedilol (COREG) 6.25 MG tablet Take 1 tablet (6.25 mg total) by mouth 2 (two) times daily with a meal. 180 tablet 3  . lisinopril (PRINIVIL,ZESTRIL) 10 MG tablet Take 1 tablet (10 mg total) by mouth daily. 90 tablet 3  . nitroGLYCERIN (NITROSTAT) 0.4 MG SL tablet Place 1 tablet (0.4 mg total) under the tongue every 5 (five) minutes x 3 doses as needed for chest pain. 25 tablet 2  . ticagrelor (BRILINTA) 90 MG TABS tablet Take 1 tablet (90 mg total) by mouth 2 (two) times daily. 180 tablet 3  . zoster vaccine live, PF, (ZOSTAVAX)  19400 UNT/0.65ML injection Inject 19,400 Units into the skin once. 1 each 0  . rosuvastatin (CRESTOR) 40 MG tablet Take 1 tablet (40 mg total) by mouth daily. 90 tablet 3   No current facility-administered medications for this visit.     Allergies:   Review of patient's allergies indicates no known allergies.   Social History:  The patient  reports that she quit smoking about 21 months ago. Her smoking use included Cigarettes. She has a 5 pack-year smoking history. She does not have  any smokeless tobacco history on file. She reports that she drinks alcohol. She reports that she does not use illicit drugs.   Family History:   family history includes Diabetes in her sister.    Review of Systems: Review of Systems  Constitutional: Negative.   Respiratory: Negative.   Cardiovascular: Negative.   Gastrointestinal: Negative.   Musculoskeletal: Negative.   Neurological: Negative.   Psychiatric/Behavioral: Negative.   All other systems reviewed and are negative.    PHYSICAL EXAM: VS:  BP 170/100 mmHg  Pulse 97  Ht 5\' 3"  (1.6 m)  Wt 174 lb 12.8 oz (79.289 kg)  BMI 30.97 kg/m2 , BMI Body mass index is 30.97 kg/(m^2). GEN: Well nourished, well developed, in no acute distress HEENT: normal Neck: no JVD, carotid bruits, or masses Cardiac: RRR; no murmurs, rubs, or gallops,no edema  Respiratory:  clear to auscultation bilaterally, normal work of breathing GI: soft, nontender, nondistended, + BS MS: no deformity or atrophy Skin: warm and dry, no rash Neuro:  Strength and sensation are intact Psych: euthymic mood, full affect    Recent Labs: No results found for requested labs within last 365 days.    Lipid Panel Lab Results  Component Value Date   CHOL 139 08/22/2014   HDL 55 08/22/2014   LDLCALC 66 08/22/2014   TRIG 88 08/22/2014      Wt Readings from Last 3 Encounters:  04/15/16 174 lb 12.8 oz (79.289 kg)  07/27/14 172 lb 12 oz (78.359 kg)  07/18/14 171 lb 12.8 oz (77.928 kg)       ASSESSMENT AND PLAN:  CAD in native artery - Plan: EKG 12-Lead Currently with no symptoms of angina. No further workup at this time. Continue current medication regimen.  Chronic systolic CHF (congestive heart failure) (HCC) Appears relatively euvolemic We will restart her blood pressure pills, recommended she closely monitor blood pressure at home  ST elevation myocardial infarction (STEMI), unspecified artery (HCC) As above, denies any anginal symptoms We'll  decrease her dose of brilinta September 2017  HLD (hyperlipidemia) Stomach issues on Lipitor Recommended she try Crestor 20 mg daily for several weeks then up to 40 mg daily  Tobacco abuse Stop smoking last year  Essential hypertension Very elevated on today's visit. Discussed with her in detail We will restart her medications that she was taking previously, strongly recommended she closely follow her blood pressure and call our office if systolic pressure continues to run more than 140 on a regular basis   Disposition:   F/U  6 months   Orders Placed This Encounter  Procedures  . EKG 12-Lead     Signed, Dossie Arbour, M.D., Ph.D. 04/15/2016  [facility] [place], [place] [phone]

## 2016-08-19 IMAGING — CR DG CHEST 1V PORT
1 series · 1 of 1 positions shown · non-contrast
Comparison: None.

CLINICAL DATA: Chest pain

EXAM:
PORTABLE CHEST - 1 VIEW

[ap]
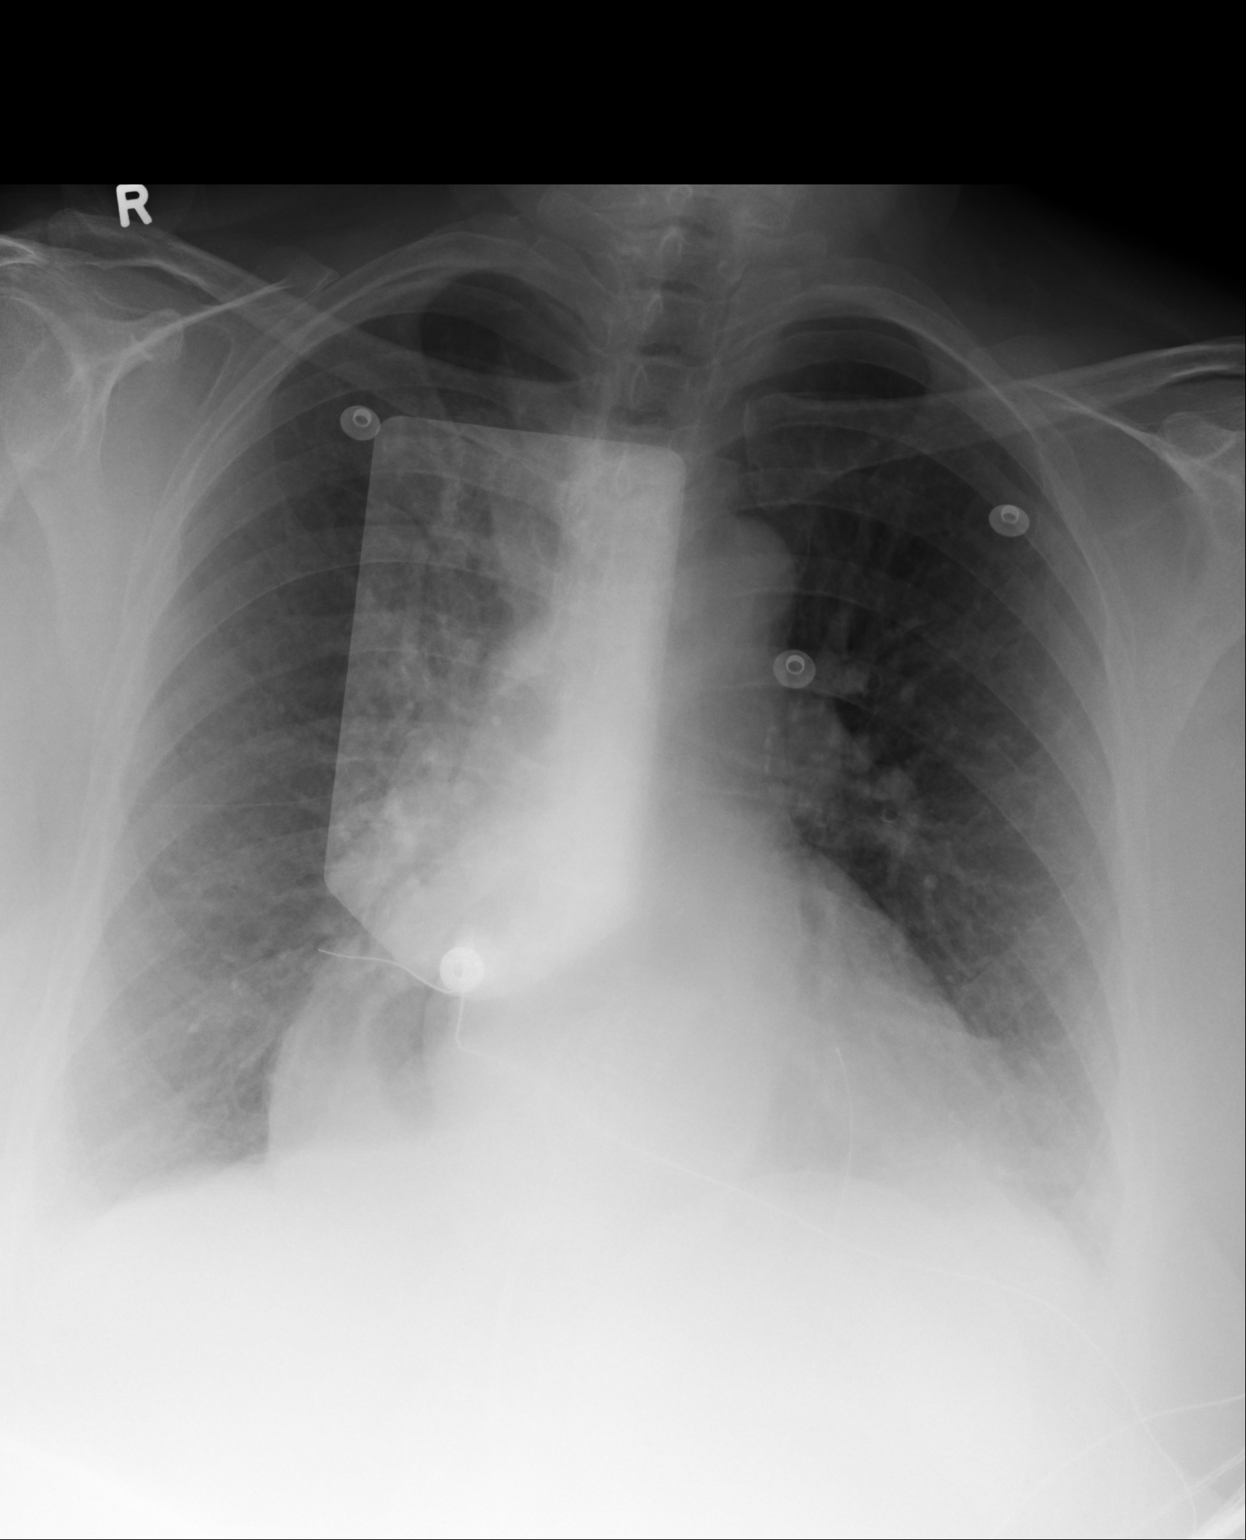

[1 of 1 positions shown; findings below may reference images not displayed]

FINDINGS: Hiatal hernia. Cardiopericardial silhouette appears enlarged. Thin
external defibrillator pad projects over the chest. Mild central
bronchitic changes and interstitial prominence. Probable left
basilar atelectasis. No focal airspace consolidation, pleural
effusion or pneumothorax. No acute osseous abnormality.
IMPRESSION: 1. Enlargement of the cardiopericardial silhouette with probable
moderate to large hiatal hernia. Cardiomegaly is difficult to
exclude.
2. Central bronchitic changes and mild interstitial prominence may
reflect chronic change, atypical respiratory infection or very mild
interstitial edema. Chronic changes are favored.

## 2017-04-10 ENCOUNTER — Other Ambulatory Visit: Payer: Self-pay

## 2017-04-10 ENCOUNTER — Telehealth: Payer: Self-pay | Admitting: Cardiovascular Disease

## 2017-04-10 MED ORDER — TICAGRELOR 90 MG PO TABS: 90.0000 mg | ORAL_TABLET | Freq: Two times a day (BID) | ORAL | 0 refills | Status: DC

## 2017-04-10 NOTE — Telephone Encounter (Signed)
Requested Prescriptions   Signed Prescriptions Disp Refills  . ticagrelor (BRILINTA) 90 MG TABS tablet 60 tablet 0    Sig: Take 1 tablet (90 mg total) by mouth 2 (two) times daily.    Authorizing Provider: Antonieta Iba    Ordering User: Margrett Rud

## 2017-04-10 NOTE — Telephone Encounter (Signed)
°*  STAT* If patient is at the pharmacy, call can be transferred to refill team.   1. Which medications need to be refilled? (please list name of each medication and dose if known) Brilinta  90 mg BID   2. Which pharmacy/location (including street and city if local pharmacy) is medication to be sent to? [facility] [address]   3. Do they need a 30 day or 90 day supply? 90

## 2017-05-08 ENCOUNTER — Other Ambulatory Visit: Payer: Self-pay | Admitting: Cardiovascular Disease

## 2017-06-10 ENCOUNTER — Other Ambulatory Visit: Payer: Self-pay | Admitting: Cardiovascular Disease

## 2017-06-20 ENCOUNTER — Other Ambulatory Visit: Payer: Self-pay | Admitting: Cardiovascular Disease

## 2017-06-20 NOTE — Telephone Encounter (Signed)
Please review for refill. Thanks.  Sent a message to [employer] (receptionist) to contact the patient for a follow up appointment.

## 2017-07-04 ENCOUNTER — Telehealth: Payer: Self-pay | Admitting: Cardiovascular Disease

## 2017-07-04 NOTE — Telephone Encounter (Signed)
Lmov for patient to call back and schedule fu appt with Dr Gollan °  °

## 2017-07-04 NOTE — Telephone Encounter (Signed)
-----   Message from Festus Aloe, CMA sent at 06/20/2017  4:15 PM EDT ----- Patient needs a follow up appt with Dr. Mariah Milling. Requesting refills.  Thanks  Jasmine December

## 2017-07-05 ENCOUNTER — Other Ambulatory Visit: Payer: Self-pay | Admitting: Cardiovascular Disease

## 2017-07-07 NOTE — Telephone Encounter (Signed)
Lmov for patient to call back and schedule fu appt with Dr Mariah Milling

## 2017-07-09 ENCOUNTER — Other Ambulatory Visit: Payer: Self-pay | Admitting: Cardiovascular Disease

## 2017-08-01 ENCOUNTER — Telehealth: Payer: Self-pay | Admitting: Cardiovascular Disease

## 2017-08-01 ENCOUNTER — Other Ambulatory Visit: Payer: Self-pay

## 2017-08-01 MED ORDER — NITROGLYCERIN 0.4 MG SL SUBL: 0.4000 mg | SUBLINGUAL_TABLET | SUBLINGUAL | 2 refills | Status: AC | PRN

## 2017-08-01 MED ORDER — CARVEDILOL 6.25 MG PO TABS: 6.2500 mg | ORAL_TABLET | Freq: Two times a day (BID) | ORAL | 0 refills | Status: DC

## 2017-08-01 MED ORDER — LISINOPRIL 10 MG PO TABS: 10.0000 mg | ORAL_TABLET | Freq: Every day | ORAL | 0 refills | Status: DC

## 2017-08-01 MED ORDER — ROSUVASTATIN CALCIUM 40 MG PO TABS: 40.0000 mg | ORAL_TABLET | Freq: Every day | ORAL | 0 refills | Status: DC

## 2017-08-01 MED ORDER — TICAGRELOR 90 MG PO TABS: 90.0000 mg | ORAL_TABLET | Freq: Two times a day (BID) | ORAL | 0 refills | Status: DC

## 2017-08-01 NOTE — Telephone Encounter (Signed)
°*  STAT* If patient is at the pharmacy, call can be transferred to refill team.   1. Which medications need to be refilled? (please list name of each medication and dose if known)    All of the heart pills  Brilinta 90 mg po BID  Coreg 6.25 mg po BID  Lisinopril 10 mg po  Nitro 0.4 mg po q 5 min Crestor 40 mg po daily   2. Which pharmacy/location (including street and city if local pharmacy) is medication to be sent to?  [facility]   3. Do they need a 30 day or 90 day supply? 30

## 2017-08-01 NOTE — Telephone Encounter (Signed)
Requested Prescriptions   Signed Prescriptions Disp Refills  . ticagrelor (BRILINTA) 90 MG TABS tablet 60 tablet 0    Sig: Take 1 tablet (90 mg total) by mouth 2 (two) times daily.    Authorizing Provider: Antonieta Iba    Ordering User: Margrett Rud carvedilol (COREG) 6.25 MG tablet 60 tablet 0    Sig: Take 1 tablet (6.25 mg total) by mouth 2 (two) times daily with a meal.    Authorizing Provider: Antonieta Iba    Ordering User: Margrett Rud  . lisinopril (PRINIVIL,ZESTRIL) 10 MG tablet 30 tablet 0    Sig: Take 1 tablet (10 mg total) by mouth daily.    Authorizing Provider: Antonieta Iba    Ordering User: Margrett Rud nitroGLYCERIN (NITROSTAT) 0.4 MG SL tablet 25 tablet 2    Sig: Place 1 tablet (0.4 mg total) under the tongue every 5 (five) minutes x 3 doses as needed for chest pain.    Authorizing Provider: Antonieta Iba    Ordering User: Margrett Rud rosuvastatin (CRESTOR) 40 MG tablet 30 tablet 0    Sig: Take 1 tablet (40 mg total) by mouth daily.    Authorizing Provider: Antonieta Iba    Ordering User: Margrett Rud

## 2017-08-01 NOTE — Telephone Encounter (Signed)
Requested Prescriptions   Signed Prescriptions Disp Refills  . ticagrelor (BRILINTA) 90 MG TABS tablet 60 tablet 0    Sig: Take 1 tablet (90 mg total) by mouth 2 (two) times daily.    Authorizing Provider: GOLLAN, TIMOTHY J    Ordering User: Akaiya Touchette N  . carvedilol (COREG) 6.25 MG tablet 60 tablet 0    Sig: Take 1 tablet (6.25 mg total) by mouth 2 (two) times daily with a meal.    Authorizing Provider: GOLLAN, TIMOTHY J    Ordering User: Jeanpaul Biehl N  . lisinopril (PRINIVIL,ZESTRIL) 10 MG tablet 30 tablet 0    Sig: Take 1 tablet (10 mg total) by mouth daily.    Authorizing Provider: GOLLAN, TIMOTHY J    Ordering User: Sona Nations N  . nitroGLYCERIN (NITROSTAT) 0.4 MG SL tablet 25 tablet 2    Sig: Place 1 tablet (0.4 mg total) under the tongue every 5 (five) minutes x 3 doses as needed for chest pain.    Authorizing Provider: GOLLAN, TIMOTHY J    Ordering User: Graeme Menees N  . rosuvastatin (CRESTOR) 40 MG tablet 30 tablet 0    Sig: Take 1 tablet (40 mg total) by mouth daily.    Authorizing Provider: GOLLAN, TIMOTHY J    Ordering User: Aishwarya Shiplett N    

## 2017-09-08 ENCOUNTER — Other Ambulatory Visit: Payer: Self-pay | Admitting: Cardiovascular Disease

## 2017-09-08 ENCOUNTER — Telehealth: Payer: Self-pay | Admitting: Cardiovascular Disease

## 2017-09-08 ENCOUNTER — Other Ambulatory Visit: Payer: Self-pay

## 2017-09-08 MED ORDER — TICAGRELOR 90 MG PO TABS: 90.0000 mg | ORAL_TABLET | Freq: Two times a day (BID) | ORAL | 0 refills | Status: DC

## 2017-09-08 NOTE — Telephone Encounter (Signed)
Requested Prescriptions   Signed Prescriptions Disp Refills  . ticagrelor (BRILINTA) 90 MG TABS tablet 180 tablet 0    Sig: Take 1 tablet (90 mg total) 2 (two) times daily by mouth.    Authorizing Provider: GOLLAN, TIMOTHY J    Ordering User: Perley Arthurs N    

## 2017-09-08 NOTE — Telephone Encounter (Signed)
Requested Prescriptions   Signed Prescriptions Disp Refills  . ticagrelor (BRILINTA) 90 MG TABS tablet 180 tablet 0    Sig: Take 1 tablet (90 mg total) 2 (two) times daily by mouth.    Authorizing Provider: Antonieta Iba    Ordering User: Margrett Rud

## 2017-09-08 NOTE — Telephone Encounter (Signed)
°*  STAT* If patient is at the pharmacy, call can be transferred to refill team. ° ° °1. Which medications need to be refilled? (please list name of each medication and dose if known)  °  Brilinta 90 mg po BID  ° °2. Which pharmacy/location (including street and city if local pharmacy) is medication to be sent to? [facility] [address]  ° °3. Do they need a 30 day or 90 day supply? 90  ° ° °

## 2017-09-09 ENCOUNTER — Ambulatory Visit: Payer: Medicare Other | Admitting: Cardiovascular Disease

## 2017-10-06 ENCOUNTER — Ambulatory Visit: Payer: Medicare Other | Admitting: Cardiovascular Disease

## 2018-01-12 ENCOUNTER — Other Ambulatory Visit: Payer: Self-pay | Admitting: Cardiovascular Disease

## 2018-01-14 NOTE — Progress Notes (Signed)
Patient ID: Jamie Gillespie, female   DOB: [birth year], 77 y.o.   MRN: [id number] Cardiology Office Note  Date:  01/16/2018   ID:  Jamie Gillespie, DOB 1941/10/02, MRN [id number]  PCP:  Patient, No Pcp Per   Chief Complaint  Patient presents with  . Other    6 month follow up. Patient denies chest pain and SOB. Patient states she is doing well and stays busy. Meds reviewed verbally with patient.    HPI:  77 y.o. female with history of  tobacco abuse,  hospital 9/14-9/17/16 for anterior STEMI  s/p PCI/DES to pLAD & mLAD,  who presents today for routine follow-up of her coronary artery disease, HTN  As on her last clinic visit she presents today out of medications no refills Denies any chest pain concerning for angina Active at baseline with no complaints Reports that she stopped smoking 2 years ago Family presents with her, they have not noticed any new issues  Reports having problems on the Crestor, irritates her stomach Would like to try different medication  He does not have primary care Discussed the need for primary care with her, contact numbers provided  Out of her medications at least this week  Blood pressure elevated on today's visit  EKG personally reviewed by myself on todays visit Shows sinus tachycardia rate 111 bpm nonspecific ST abnormality left axis deviation Unable to exclude old inferior MI  Other past medical history At the time of her STEMI,TnI trended to greater than 20.  cath lab revealed: LM nl, LAD occluded at the first septal perforator (this was the infarct related vessel) s/p PCI/Xience Alpine DES to pLAD & mLAD, LCx nondominant and free of disease, RCA large vessel with 50-60% segmental stenosis in the distal portion, LVEF 35-40% with WMA abnormalities notable for anteroseptal akinesia.    2-D echo on 9/15 revealed EF 35-40%, GR2DD, moderate LVH, &Akinesis of the entire anteroseptal myocardium.    PMH:   has a past medical history of CAD (coronary  artery disease), Chronic systolic CHF (congestive heart failure) (HCC), and Tobacco abuse.  PSH:    Past Surgical History:  Procedure Laterality Date  . ABDOMINAL HYSTERECTOMY  1997  . CARDIAC CATHETERIZATION  06/2014  . CORONARY ANGIOPLASTY  06/2014   a. cath 06/2014: LM nl, LAD occulded @ first septal perforator s/p PCI/DES pLAD &mLAD, LCx no sig dz, RCA 50-60%,   . LEFT HEART CATHETERIZATION WITH CORONARY ANGIOGRAM N/A 07/11/2014   Procedure: LEFT HEART CATHETERIZATION WITH CORONARY ANGIOGRAM;  Surgeon: Runell Gess, MD;  Location[facility] C[facility] LAB;  Service: Cardiovascular;  Laterality: N/A;    Current Outpatient Medications  Medication Sig Dispense Refill  . acetaminophen (TYLENOL) 325 MG tablet Take 325 mg by mouth every 6 (six) hours as needed for mild pain or moderate pain.    Marland Kitchen aspirin 81 MG chewable tablet Chew 1 tablet (81 mg total) by mouth daily.    . carvedilol (COREG) 6.25 MG tablet Take 1 tablet (6.25 mg total) by mouth 2 (two) times daily with a meal. 180 tablet 3  . lisinopril (PRINIVIL,ZESTRIL) 10 MG tablet Take 1 tablet (10 mg total) by mouth daily. 90 tablet 3  . nitroGLYCERIN (NITROSTAT) 0.4 MG SL tablet Place 1 tablet (0.4 mg total) under the tongue every 5 (five) minutes x 3 doses as needed for chest pain. 25 tablet 2  . ticagrelor (BRILINTA) 60 MG TABS tablet Take 1 tablet (60 mg total) by mouth 2 (two) times daily. 180 tablet 3  .  atorvastatin (LIPITOR) 40 MG tablet Take 1 tablet (40 mg total) by mouth daily. 90 tablet 3   No current facility-administered medications for this visit.      Allergies:   Patient has no known allergies.   Social History:  The patient  reports that she quit smoking about 3 years ago. Her smoking use included cigarettes. She has a 5.00 pack-year smoking history. She has never used smokeless tobacco. She reports that she drank alcohol. She reports that she does not use drugs.   Family History:   family history includes Diabetes in her  sister.    Review of Systems: Review of Systems  Constitutional: Negative.   Respiratory: Negative.   Cardiovascular: Negative.   Gastrointestinal: Negative.   Musculoskeletal: Negative.   Neurological: Negative.   Psychiatric/Behavioral: Negative.   All other systems reviewed and are negative.    PHYSICAL EXAM: VS:  BP (!) 195/107 (BP Location: Left Arm, Patient Position: Sitting, Cuff Size: Normal)   Pulse (!) 111   Ht 5\' 3"  (1.6 m)   Wt 185 lb 8 oz (84.1 kg)   BMI 32.86 kg/m  , BMI Body mass index is 32.86 kg/m. Constitutional:  oriented to person, place, and time. No distress.  HENT:  Head: Normocephalic and atraumatic.  Eyes:  no discharge. No scleral icterus.  Neck: Normal range of motion. Neck supple. No JVD present.  Cardiovascular: Tachycardic , normal rate, regular rhythm, normal heart sounds and intact distal pulses. Exam reveals no gallop and no friction rub. No edema No murmur heard. Pulmonary/Chest: Effort normal and breath sounds normal. No stridor. No respiratory distress.  no wheezes.  no rales.  no tenderness.  Abdominal: Soft.  no distension.  no tenderness.  Musculoskeletal: Normal range of motion.  no  tenderness or deformity.  Neurological:  normal muscle tone. Coordination normal. No atrophy Skin: Skin is warm and dry. No rash noted. not diaphoretic.  Psychiatric:  normal mood and affect. behavior is normal. Thought content normal.    Recent Labs: No results found for requested labs within last 8760 hours.    Lipid Panel Lab Results  Component Value Date   CHOL 139 08/22/2014   HDL 55 08/22/2014   LDLCALC 66 08/22/2014   TRIG 88 08/22/2014      Wt Readings from Last 3 Encounters:  01/16/18 185 lb 8 oz (84.1 kg)  04/15/16 174 lb 12.8 oz (79.3 kg)  07/27/14 172 lb 12 oz (78.4 kg)       ASSESSMENT AND PLAN:  CAD in native artery - Plan: EKG 12-Lead Currently with no symptoms of angina. No further workup at this time. Continue current  medication regimen.  Stable Long discussion concerning anginal symptoms to watch for  Chronic systolic CHF (congestive heart failure) (HCC) Appears relatively euvolemic We will restart her blood pressure pills,  she will monitor blood pressure at home  ST elevation myocardial infarction (STEMI), unspecified artery (HCC) decrease her dose of brilinta   she prefers to stay on aspirin and Brilinta Stressed importance of staying on cholesterol medication She stopped smoking 2 years ago  HLD (hyperlipidemia) Stomach problems on statins She would like to retry the other "white one" we will retry the atorvastatin  Tobacco abuse  has not smoked in 2 years  Essential hypertension Very elevated on today's visit. Discussed with her in detail We will restart her medications  Recommend she closely monitor blood pressure at home and call us if it runs high Discussed the importance of  aggressive blood pressure control with family   Disposition:   F/U  12 months   Total encounter time more than 25 minutes  Greater than 50% was spent in counseling and coordination of care with the patient    Orders Placed This Encounter  Procedures  . EKG 12-Lead     Signed, Dossie Arbour, M.D., Ph.D. 01/16/2018  [facility] [place], [place] [phone]

## 2018-01-16 ENCOUNTER — Ambulatory Visit (INDEPENDENT_AMBULATORY_CARE_PROVIDER_SITE_OTHER): Payer: Medicare Other | Admitting: Cardiovascular Disease

## 2018-01-16 ENCOUNTER — Encounter: Payer: Self-pay | Admitting: Cardiovascular Disease

## 2018-01-16 VITALS — BP 195/107 | HR 111 | Ht 63.0 in | Wt 185.5 lb

## 2018-01-16 DIAGNOSIS — I10 Essential (primary) hypertension: Secondary | ICD-10-CM

## 2018-01-16 DIAGNOSIS — E782 Mixed hyperlipidemia: Secondary | ICD-10-CM | POA: Diagnosis not present

## 2018-01-16 DIAGNOSIS — Z0000 Encounter for general adult medical examination without abnormal findings: Secondary | ICD-10-CM

## 2018-01-16 DIAGNOSIS — Z720 Tobacco use: Secondary | ICD-10-CM

## 2018-01-16 DIAGNOSIS — I25118 Atherosclerotic heart disease of native coronary artery with other forms of angina pectoris: Secondary | ICD-10-CM

## 2018-01-16 DIAGNOSIS — I213 ST elevation (STEMI) myocardial infarction of unspecified site: Secondary | ICD-10-CM | POA: Diagnosis not present

## 2018-01-16 DIAGNOSIS — I5022 Chronic systolic (congestive) heart failure: Secondary | ICD-10-CM

## 2018-01-16 MED ORDER — TICAGRELOR 60 MG PO TABS: 60.0000 mg | ORAL_TABLET | Freq: Two times a day (BID) | ORAL | 3 refills | Status: DC

## 2018-01-16 MED ORDER — LISINOPRIL 10 MG PO TABS: 10.0000 mg | ORAL_TABLET | Freq: Every day | ORAL | 3 refills | Status: DC

## 2018-01-16 MED ORDER — CARVEDILOL 6.25 MG PO TABS: 6.2500 mg | ORAL_TABLET | Freq: Two times a day (BID) | ORAL | 3 refills | Status: DC

## 2018-01-16 MED ORDER — ATORVASTATIN CALCIUM 40 MG PO TABS: 40.0000 mg | ORAL_TABLET | Freq: Every day | ORAL | 3 refills | Status: DC

## 2018-01-16 NOTE — Patient Instructions (Addendum)
   Medication Instructions:   Decrease the brilinta down from 90 mg to 60 mg twice a day Change the crestor to atorvastatin one a day Stay on the same other medications  Labwork:  No new labs needed  Testing/Procedures:  No further testing at this time   Follow-Up: It was a pleasure seeing you in the office today. Please call us if you have new issues that need to be addressed before your next appt.  [phone]  Your physician wants you to follow-up in: 12 months.  You will receive a reminder letter in the mail two months in advance. If you don't receive a letter, please call our office to schedule the follow-up appointment.  If you need a refill on your cardiac medications before your next appointment, please call your pharmacy.  For educational health videos Log in to : [email or web address] Or : [email or web address], password : [email or web address]

## 2019-01-07 ENCOUNTER — Other Ambulatory Visit: Payer: Self-pay

## 2019-01-07 MED ORDER — LISINOPRIL 10 MG PO TABS: 10.0000 mg | ORAL_TABLET | Freq: Every day | ORAL | 0 refills | Status: DC

## 2019-01-07 NOTE — Telephone Encounter (Signed)
*  STAT* If patient is at the pharmacy, call can be transferred to refill team.   1. Which medications need to be refilled? (please list name of each medication and dose if known) Lisinopril  2. Which pharmacy/location (including street and city if local pharmacy) is medication to be sent to?  [facility]   3. Do they need a 30 day or 90 day supply? 90 day

## 2019-02-01 ENCOUNTER — Other Ambulatory Visit: Payer: Self-pay | Admitting: Cardiovascular Disease

## 2019-04-07 ENCOUNTER — Other Ambulatory Visit: Payer: Self-pay | Admitting: Cardiovascular Disease

## 2019-04-19 ENCOUNTER — Other Ambulatory Visit: Payer: Self-pay | Admitting: Cardiovascular Disease

## 2019-04-19 ENCOUNTER — Telehealth: Payer: Self-pay

## 2019-04-19 NOTE — Telephone Encounter (Signed)
Left message requesting for patient to call back to schedule F/U appointment with Dr. Rockey Situ.

## 2019-04-19 NOTE — Telephone Encounter (Signed)
Left message requesting for patient to call back to schedule F/U appointment with Dr. Gollan. 

## 2019-04-20 ENCOUNTER — Other Ambulatory Visit: Payer: Self-pay | Admitting: Cardiovascular Disease

## 2019-04-20 NOTE — Telephone Encounter (Signed)
Left a message to call back regarding a follow up appointment.

## 2019-06-07 ENCOUNTER — Ambulatory Visit: Payer: Medicare Other | Admitting: Cardiovascular Disease

## 2019-06-21 ENCOUNTER — Other Ambulatory Visit: Payer: Self-pay | Admitting: Cardiovascular Disease

## 2019-06-21 NOTE — Telephone Encounter (Signed)
Left v/m message requesting for patient to call back to schedule appointment with Dr. Gollan. 

## 2019-06-21 NOTE — Telephone Encounter (Signed)
Please call to schedule overdue F/U with Dr. Gollan. Thank you! 

## 2019-06-22 NOTE — Telephone Encounter (Signed)
A voicemail has been left for patient to call and schedule overdue fu

## 2019-06-30 NOTE — Telephone Encounter (Signed)
Left a message on voice mail to call the office regarding a follow up appointment and a refill for Carvedilol 6.25 mg

## 2019-07-01 ENCOUNTER — Other Ambulatory Visit: Payer: Self-pay | Admitting: Cardiovascular Disease

## 2019-07-01 ENCOUNTER — Telehealth: Payer: Self-pay | Admitting: Cardiovascular Disease

## 2019-07-01 NOTE — Telephone Encounter (Signed)
-----   Message from Anselm Pancoast, [place] sent at 07/01/2019 11:38 AM EDT ----- Please contact the patient regarding a follow up appointment.  Thanks, Ivin Booty

## 2019-07-01 NOTE — Telephone Encounter (Signed)
Patient has been lvm to callback and schedule. °

## 2019-07-09 ENCOUNTER — Other Ambulatory Visit: Payer: Self-pay | Admitting: Cardiovascular Disease

## 2019-07-09 ENCOUNTER — Other Ambulatory Visit: Payer: Self-pay

## 2019-07-09 MED ORDER — LISINOPRIL 10 MG PO TABS: 10.0000 mg | ORAL_TABLET | Freq: Every day | ORAL | 0 refills | Status: DC

## 2019-07-09 NOTE — Telephone Encounter (Signed)
Pt overdue for 12 month f/u. °Please contact pt for future appointment. °

## 2019-07-21 NOTE — Telephone Encounter (Signed)
-----   Message from Anselm Pancoast, [place] sent at 07/01/2019 11:38 AM EDT ----- Please contact the patient regarding a follow up appointment.  Thanks, Ivin Booty

## 2019-07-21 NOTE — Telephone Encounter (Signed)
LVM for patient to call back and schedule

## 2019-08-09 NOTE — Telephone Encounter (Signed)
lmov to schedule  °

## 2019-08-10 ENCOUNTER — Other Ambulatory Visit: Payer: Self-pay | Admitting: Cardiovascular Disease

## 2019-08-11 NOTE — Telephone Encounter (Signed)
lmov to schedule  °

## 2019-08-11 NOTE — Telephone Encounter (Signed)
Unable to reach patient .  lmov x 3 .  Mailed letter .

## 2019-08-11 NOTE — Telephone Encounter (Signed)
Unable to contact.  Mailed letter.  Unable to close rx encounter

## 2019-08-19 ENCOUNTER — Telehealth: Payer: Self-pay

## 2019-08-19 ENCOUNTER — Other Ambulatory Visit: Payer: Self-pay | Admitting: *Deleted

## 2019-08-19 MED ORDER — LISINOPRIL 10 MG PO TABS: 10.0000 mg | ORAL_TABLET | Freq: Every day | ORAL | 0 refills | Status: DC

## 2019-08-19 NOTE — Telephone Encounter (Signed)
Requested Prescriptions   Signed Prescriptions Disp Refills  . lisinopril (ZESTRIL) 10 MG tablet 90 tablet 0    Sig: Take 1 tablet (10 mg total) by mouth daily.    Authorizing Provider: Minna Merritts    Ordering User: Britt Bottom

## 2019-08-19 NOTE — Telephone Encounter (Signed)
Pt overdue for 12 month f/u. °Please contact pt for future appointment. °

## 2019-08-19 NOTE — Telephone Encounter (Signed)
Scheduled

## 2019-08-19 NOTE — Telephone Encounter (Signed)
Patient needs her BP medication refilled she is not sure the name. [facility]  90 day supply

## 2019-08-19 NOTE — Telephone Encounter (Signed)
Requested Prescriptions   Signed Prescriptions Disp Refills  . lisinopril (ZESTRIL) 10 MG tablet 90 tablet 0    Sig: Take 1 tablet (10 mg total) by mouth daily.    Authorizing Provider: GOLLAN, TIMOTHY J    Ordering User: LOPEZ, MARINA C    

## 2019-08-23 NOTE — Progress Notes (Signed)
Virtual Visit via Video Note   This visit type was conducted due to national recommendations for restrictions regarding the COVID-19 Pandemic (e.g. social distancing) in an effort to limit this patient's exposure and mitigate transmission in our community.  Due to her co-morbid illnesses, this patient is at least at moderate risk for complications without adequate follow up.  This format is felt to be most appropriate for this patient at this time.  All issues noted in this document were discussed and addressed.  A limited physical exam was performed with this format.  Please refer to the patient's chart for her consent to telehealth for [facility].   I connected with  Denver Faster on 08/25/19 by a video enabled telemedicine application and verified that I am speaking with the correct person using two identifiers. I discussed the limitations of evaluation and management by telemedicine. The patient expressed understanding and agreed to proceed.   Evaluation Performed:  Follow-up visit  Date:  08/25/2019   ID:  Jamie Gillespie, DOB 04/27/1941, MRN [id number]  Patient Location:  [address] [place] [address]   Provider location:   [facility], [employer] office  PCP:  Patient, No Pcp Per  Cardiologist:  Fonnie Mu  Chief Complaint  Patient presents with  . Other    past due 12 month follow up. Patient denies chest pain and SOB at this time. Meds reviewed verbally with patient.      History of Present Illness:    Jamie Gillespie is a 78 y.o. female who presents via audio/video conferencing for a telehealth visit today.   The patient does not symptoms concerning for COVID-19 infection (fever, chills, cough, or new SHORTNESS OF BREATH).   Patient has a past medical history of 78 y.o. female with history of tobacco abuse,  hospital 9/14-9/17/16 for anterior STEMI  s/p PCI/DES to pLAD & mLAD,  who presents today for routine follow-up of her  coronary artery disease, HTN  Not doing much, staying inside Reports that she stopped smoking 3 years ago No angina  No PMD No labs No BP cuff  Reports having problems on the Crestor, irritates her stomach Denies having any problems on the Lipitor  He does not have primary care On her last clinic visit did not have primary care Previously we discussed the need for primary care with her, contact numbers provided On today's discussion still does not have primary care  Feels uncomfortable leaving her house for anything at this time in the setting of virus  Other past medical history At the time of her STEMI,TnI trended to greater than 20.  cath lab revealed: LM nl, LAD occluded at the first septal perforator (this was the infarct related vessel) s/p PCI/Xience Alpine DES to pLAD & mLAD, LCx nondominant and free of disease, RCA large vessel with 50-60% segmental stenosis in the distal portion, LVEF 35-40% with WMA abnormalities notable for anteroseptal akinesia.    2-D echo on 9/15 revealed EF 35-40%, GR2DD, moderate LVH, &Akinesis of the entire anteroseptal myocardium.     Prior CV studies:   The following studies were reviewed today:    Past Medical History:  Diagnosis Date  . CAD (coronary artery disease)    a. STEMI 07/11/2014; b. cath 06/2014: LM nl, LAD occulded @ first septal perforator s/p PCI/DES pLAD &mLAD, LCx no sig dz, RCA 50-60%,   . Chronic systolic CHF (congestive heart failure) (HCC)    a. echo 07/12/14: EF  35-40%, mod LVH, AK of entire anteroseptal myocardium, GR2DD  . Tobacco abuse    Past Surgical History:  Procedure Laterality Date  . ABDOMINAL HYSTERECTOMY  1997  . CARDIAC CATHETERIZATION  06/2014  . CORONARY ANGIOPLASTY  06/2014   a. cath 06/2014: LM nl, LAD occulded @ first septal perforator s/p PCI/DES pLAD &mLAD, LCx no sig dz, RCA 50-60%,   . LEFT HEART CATHETERIZATION WITH CORONARY ANGIOGRAM N/A 07/11/2014   Procedure: LEFT HEART CATHETERIZATION WITH  CORONARY ANGIOGRAM;  Surgeon: Runell Gess, MD;  Location: [facility] CATH LAB;  Service: Cardiovascular;  Laterality: N/A;      Allergies:   Patient has no known allergies.   Social History   Tobacco Use  . Smoking status: Former Smoker    Packs/day: 0.50    Years: 10.00    Pack years: 5.00    Types: Cigarettes    Quit date: 07/11/2014    Years since quitting: 5.1  . Smokeless tobacco: Never Used  Substance Use Topics  . Alcohol use: Not Currently    Frequency: Never    Comment: Occasionally, on weekend  . Drug use: No     Current Outpatient Medications on File Prior to Visit  Medication Sig Dispense Refill  . acetaminophen (TYLENOL) 325 MG tablet Take 325 mg by mouth every 6 (six) hours as needed for mild pain or moderate pain.    Marland Kitchen aspirin 81 MG chewable tablet Chew 1 tablet (81 mg total) by mouth daily.    Marland Kitchen atorvastatin (LIPITOR) 40 MG tablet Take 1 tablet (40 mg total) by mouth daily. 90 tablet 3  . BRILINTA 60 MG TABS tablet TAKE 1 TABLET(60 MG) BY MOUTH TWICE DAILY 180 tablet 1  . carvedilol (COREG) 6.25 MG tablet TAKE 1 TABLET(6.25 MG) BY MOUTH TWICE DAILY WITH A MEAL 60 tablet 0  . lisinopril (ZESTRIL) 10 MG tablet Take 1 tablet (10 mg total) by mouth daily. 90 tablet 0  . nitroGLYCERIN (NITROSTAT) 0.4 MG SL tablet Place 1 tablet (0.4 mg total) under the tongue every 5 (five) minutes x 3 doses as needed for chest pain. 25 tablet 2   No current facility-administered medications on file prior to visit.      Family Hx: The patient's family history includes Diabetes in her sister.  ROS:   Please see the history of present illness.    Review of Systems  Constitutional: Negative.   HENT: Negative.   Respiratory: Negative.   Cardiovascular: Negative.   Gastrointestinal: Negative.   Musculoskeletal: Negative.   Neurological: Negative.   Psychiatric/Behavioral: Negative.   All other systems reviewed and are negative.    Labs/Other Tests and Data Reviewed:    Recent  Labs: No results found for requested labs within last 8760 hours.   Recent Lipid Panel Lab Results  Component Value Date/Time   CHOL 139 08/22/2014 10:12 AM   TRIG 88 08/22/2014 10:12 AM   HDL 55 08/22/2014 10:12 AM   CHOLHDL 2.5 08/22/2014 10:12 AM   CHOLHDL 4.1 07/12/2014 03:04 AM   LDLCALC 66 08/22/2014 10:12 AM    Wt Readings from Last 3 Encounters:  08/25/19 172 lb (78 kg)  01/16/18 185 lb 8 oz (84.1 kg)  04/15/16 174 lb 12.8 oz (79.3 kg)     Exam:    Vital Signs: Vital signs may also be detailed in the HPI Ht 5\' 3"  (1.6 m)   Wt 172 lb (78 kg)   BMI 30.47 kg/m   Wt Readings from Last  3 Encounters:  08/25/19 172 lb (78 kg)  01/16/18 185 lb 8 oz (84.1 kg)  04/15/16 174 lb 12.8 oz (79.3 kg)   Temp Readings from Last 3 Encounters:  07/18/14 98.2 F (36.8 C) (Oral)  07/14/14 97.9 F (36.6 C) (Oral)   BP Readings from Last 3 Encounters:  01/16/18 (!) 195/107  04/15/16 (!) 170/100  07/27/14 (!) 152/92   Pulse Readings from Last 3 Encounters:  01/16/18 (!) 111  04/15/16 97  07/27/14 69     Well nourished, well developed female in no acute distress. Constitutional:  oriented to person, place, and time. No distress.    ASSESSMENT & PLAN:    Problem List Items Addressed This Visit      Cardiology Problems   Essential hypertension   Chronic systolic CHF (congestive heart failure) (HCC) - Primary   HLD (hyperlipidemia)     Other   Tobacco abuse    Other Visit Diagnoses    Atherosclerosis of native coronary artery of native heart with stable angina pectoris ([place])         CAD with stable angina Currently with no symptoms of angina. No further workup at this time. Continue current medication regimen.  Smoker Quit 2-3 years ago  Hyperlipidemia We have ordered labs [facility]  Hypertension Previously ran out of medications and blood pressure was high Reports she does have her medications and we will refill them today Does not have any numbers from  home, reports that she will buy blood pressure cuff Stressed the importance of measuring blood pressure  Preventive care Stressed importance of getting a primary care physician Importance of regular exercise, walking program, Also stressed need for blood pressure cuff at home and call us with numbers   COVID-19 Education: The signs and symptoms of COVID-19 were discussed with the patient and how to seek care for testing (follow up with PCP or arrange E-visit).  The importance of social distancing was discussed today.  Patient Risk:   After full review of this patients clinical status, I feel that they are at least moderate risk at this time.  Time:   Today, I have spent 25 minutes with the patient with telehealth technology discussing the cardiac and medical problems/diagnoses detailed above   Additional 10 min spent reviewing the chart prior to patient visit today   Medication Adjustments/Labs and Tests Ordered: Current medicines are reviewed at length with the patient today.  Concerns regarding medicines are outlined above.   Tests Ordered: No tests ordered   Medication Changes: No changes made   Disposition: Follow-up in 12 months   Signed, Ida Rogue, MD  [place] Office [address], [place], [place] [address]

## 2019-08-25 ENCOUNTER — Telehealth (INDEPENDENT_AMBULATORY_CARE_PROVIDER_SITE_OTHER): Payer: Medicare Other | Admitting: Cardiovascular Disease

## 2019-08-25 ENCOUNTER — Telehealth: Payer: Self-pay

## 2019-08-25 ENCOUNTER — Other Ambulatory Visit: Payer: Self-pay

## 2019-08-25 VITALS — Ht 63.0 in | Wt 172.0 lb

## 2019-08-25 DIAGNOSIS — I10 Essential (primary) hypertension: Secondary | ICD-10-CM | POA: Diagnosis not present

## 2019-08-25 DIAGNOSIS — E782 Mixed hyperlipidemia: Secondary | ICD-10-CM | POA: Diagnosis not present

## 2019-08-25 DIAGNOSIS — I25118 Atherosclerotic heart disease of native coronary artery with other forms of angina pectoris: Secondary | ICD-10-CM

## 2019-08-25 DIAGNOSIS — Z720 Tobacco use: Secondary | ICD-10-CM | POA: Diagnosis not present

## 2019-08-25 DIAGNOSIS — I5022 Chronic systolic (congestive) heart failure: Secondary | ICD-10-CM

## 2019-08-25 MED ORDER — TICAGRELOR 60 MG PO TABS: ORAL_TABLET | ORAL | 3 refills | Status: DC

## 2019-08-25 MED ORDER — LISINOPRIL 10 MG PO TABS: 10.0000 mg | ORAL_TABLET | Freq: Every day | ORAL | 3 refills | Status: DC

## 2019-08-25 MED ORDER — ATORVASTATIN CALCIUM 40 MG PO TABS: 40.0000 mg | ORAL_TABLET | Freq: Every day | ORAL | 3 refills | Status: DC

## 2019-08-25 MED ORDER — CARVEDILOL 6.25 MG PO TABS: ORAL_TABLET | ORAL | 3 refills | Status: DC

## 2019-08-25 NOTE — Telephone Encounter (Signed)
YOUR CARDIOLOGY TEAM HAS ARRANGED FOR AN E-VISIT FOR YOUR APPOINTMENT - PLEASE REVIEW IMPORTANT INFORMATION BELOW SEVERAL DAYS PRIOR TO YOUR APPOINTMENT ° °Due to the recent COVID-19 pandemic, we are transitioning in-person office visits to tele-medicine visits in an effort to decrease unnecessary exposure to our patients, their families, and staff. These visits are billed to your insurance just like a normal visit is. We also encourage you to sign up for MyChart if you have not already done so. You will need a smartphone if possible. For patients that do not have this, we can still complete the visit using a regular telephone but do prefer a smartphone to enable video when possible. You may have a family member that lives with you that can help. If possible, we also ask that you have a blood pressure cuff and scale at home to measure your blood pressure, heart rate and weight prior to your scheduled appointment. Patients with clinical needs that need an in-person evaluation and testing will still be able to come to the office if absolutely necessary. If you have any questions, feel free to call our office. ° ° ° ° °YOUR PROVIDER WILL BE USING THE FOLLOWING PLATFORM TO COMPLETE YOUR VISIT:  ° °• IF USING DOXIMITY or [email or web address] - The staff will give you instructions on receiving your link to join the meeting the day of your visit.  ° ° °2-3 DAYS BEFORE YOUR APPOINTMENT ° °You will receive a telephone call from one of our [facility] team members - your caller ID may say "Unknown caller." If this is a video visit, we will walk you through how to get the video launched on your phone. We will remind you check your blood pressure, heart rate and weight prior to your scheduled appointment. If you have an Apple Watch or Kardia, please upload any pertinent ECG strips the day before or morning of your appointment to MyChart. Our staff will also make sure you have reviewed the consent and agree to move forward with your scheduled  tele-health visit.  ° ° ° °THE DAY OF YOUR APPOINTMENT ° °Approximately 15 minutes prior to your scheduled appointment, you will receive a telephone call from one of HeartCare team - your caller ID may say "Unknown caller."  Our staff will confirm medications, vital signs for the day and any symptoms you may be experiencing. Please have this information available prior to the time of visit start. It may also be helpful for you to have a pad of paper and pen handy for any instructions given during your visit. They will also walk you through joining the smartphone meeting if this is a video visit. ° ° ° °CONSENT FOR TELE-HEALTH VISIT - PLEASE REVIEW ° °I hereby voluntarily request, consent and authorize [facility] and its employed or contracted physicians, physician assistants, nurse practitioners or other licensed health care professionals (the Practitioner), to provide me with telemedicine health care services (the “Services") as deemed necessary by the treating Practitioner. I acknowledge and consent to receive the Services by the Practitioner via telemedicine. I understand that the telemedicine visit will involve communicating with the Practitioner through live audiovisual communication technology and the disclosure of certain medical information by electronic transmission. I acknowledge that I have been given the opportunity to request an in-person assessment or other available alternative prior to the telemedicine visit and am voluntarily participating in the telemedicine visit. ° °I understand that I have the right to withhold or withdraw my consent to the use of   telemedicine in the course of my care at any time, without affecting my right to future care or treatment, and that the Practitioner or I may terminate the telemedicine visit at any time. I understand that I have the right to inspect all information obtained and/or recorded in the course of the telemedicine visit and may receive copies of available  information for a reasonable fee.  I understand that some of the potential risks of receiving the Services via telemedicine include:  °• Delay or interruption in medical evaluation due to technological equipment failure or disruption; °• Information transmitted may not be sufficient (e.g. poor resolution of images) to allow for appropriate medical decision making by the Practitioner; and/or  °• In rare instances, security protocols could fail, causing a breach of personal health information. ° °Furthermore, I acknowledge that it is my responsibility to provide information about my medical history, conditions and care that is complete and accurate to the best of my ability. I acknowledge that Practitioner's advice, recommendations, and/or decision may be based on factors not within their control, such as incomplete or inaccurate data provided by me or distortions of diagnostic images or specimens that may result from electronic transmissions. I understand that the practice of medicine is not an exact science and that Practitioner makes no warranties or guarantees regarding treatment outcomes. I acknowledge that I will receive a copy of this consent concurrently upon execution via email to the email address I last provided but may also request a printed copy by calling the office of [facility].   ° °I understand that my insurance will be billed for this visit.  ° °I have read or had this consent read to me. °• I understand the contents of this consent, which adequately explains the benefits and risks of the Services being provided via telemedicine.  °• I have been provided ample opportunity to ask questions regarding this consent and the Services and have had my questions answered to my satisfaction. °• I give my informed consent for the services to be provided through the use of telemedicine in my medical care ° °By participating in this telemedicine visit I agree to the above. °

## 2019-08-25 NOTE — Patient Instructions (Addendum)
It was a pleasure speaking with you today! - Nira Conn, RN   Call [facility])  @ [phone] to assist with finding a Primary Care Doctor  Medication Instructions:  No changes  If you need a refill on your cardiac medications before your next appointment, please call your pharmacy.   Lab work: - Your physician recommends that you have FASTING lab work: CBC/ CMET/ Lipid panel (lab slip mailed for [employer])  If you have labs (blood work) drawn today and your tests are completely normal, you will receive your results only by: Marland Kitchen MyChart Message (if you have MyChart) OR . A paper copy in the mail If you have any lab test that is abnormal or we need to change your treatment, we will call you to review the results.   Testing/Procedures: No new testing needed   Follow-Up: At [facility], you and your health needs are our priority.  As part of our continuing mission to provide you with exceptional heart care, we have created designated Provider Care Teams.  These Care Teams include your primary Cardiologist (physician) and Advanced Practice Providers (APPs -  Physician Assistants and Nurse Practitioners) who all work together to provide you with the care you need, when you need it.  . You will need a follow up appointment in 6 months in office .   Please call our office 2 months in advance to schedule this appointment.    . Providers on your designated Care Team:   . Murray Hodgkins, NP . Christell Faith, PA-C . Marrianne Mood, PA-C  Any Other Special Instructions Will Be Listed Below (If Applicable).  For educational health videos Log in to : [email or web address] Or : [email or web address], password : [email or web address]

## 2020-10-23 ENCOUNTER — Other Ambulatory Visit: Payer: Self-pay | Admitting: Cardiovascular Disease

## 2020-10-23 NOTE — Telephone Encounter (Signed)
Rx request sent to pharmacy.  

## 2020-11-07 ENCOUNTER — Other Ambulatory Visit: Payer: Self-pay | Admitting: Cardiovascular Disease

## 2020-11-07 NOTE — Telephone Encounter (Signed)
Pt overdue for 12 month f/u. °Please contact pt for future appointment. °

## 2020-11-07 NOTE — Telephone Encounter (Signed)
LVM to schedule overdue fu °

## 2020-11-13 ENCOUNTER — Other Ambulatory Visit: Payer: Self-pay | Admitting: Cardiovascular Disease

## 2020-11-15 ENCOUNTER — Telehealth: Payer: Self-pay | Admitting: Cardiovascular Disease

## 2020-11-15 MED ORDER — LISINOPRIL 10 MG PO TABS: 10.0000 mg | ORAL_TABLET | Freq: Every day | ORAL | 3 refills | Status: DC

## 2020-11-15 NOTE — Telephone Encounter (Signed)
LVM to call back and schedule  °

## 2020-11-15 NOTE — Telephone Encounter (Signed)
Scheduled for 12/1.

## 2020-11-15 NOTE — Telephone Encounter (Signed)
°  Patient Consent for Virtual Visit         Denene Kluender has provided verbal consent on 11/15/2020 for a virtual visit (video or telephone).   CONSENT FOR VIRTUAL VISIT FOR:  Denver Faster  By participating in this virtual visit I agree to the following:  I hereby voluntarily request, consent and authorize [facility] and its employed or contracted physicians, physician assistants, nurse practitioners or other licensed health care professionals (the Practitioner), to provide me with telemedicine health care services (the Services") as deemed necessary by the treating Practitioner. I acknowledge and consent to receive the Services by the Practitioner via telemedicine. I understand that the telemedicine visit will involve communicating with the Practitioner through live audiovisual communication technology and the disclosure of certain medical information by electronic transmission. I acknowledge that I have been given the opportunity to request an in-person assessment or other available alternative prior to the telemedicine visit and am voluntarily participating in the telemedicine visit.  I understand that I have the right to withhold or withdraw my consent to the use of telemedicine in the course of my care at any time, without affecting my right to future care or treatment, and that the Practitioner or I may terminate the telemedicine visit at any time. I understand that I have the right to inspect all information obtained and/or recorded in the course of the telemedicine visit and may receive copies of available information for a reasonable fee.  I understand that some of the potential risks of receiving the Services via telemedicine include:   Delay or interruption in medical evaluation due to technological equipment failure or disruption;  Information transmitted may not be sufficient (e.g. poor resolution of images) to allow for appropriate medical decision making by the Practitioner;  and/or   In rare instances, security protocols could fail, causing a breach of personal health information.  Furthermore, I acknowledge that it is my responsibility to provide information about my medical history, conditions and care that is complete and accurate to the best of my ability. I acknowledge that Practitioner's advice, recommendations, and/or decision may be based on factors not within their control, such as incomplete or inaccurate data provided by me or distortions of diagnostic images or specimens that may result from electronic transmissions. I understand that the practice of medicine is not an exact science and that Practitioner makes no warranties or guarantees regarding treatment outcomes. I acknowledge that a copy of this consent can be made available to me via my patient portal [facility] MyChart), or I can request a printed copy by calling the office of [facility].    I understand that my insurance will be billed for this visit.   I have read or had this consent read to me.  I understand the contents of this consent, which adequately explains the benefits and risks of the Services being provided via telemedicine.   I have been provided ample opportunity to ask questions regarding this consent and the Services and have had my questions answered to my satisfaction.  I give my informed consent for the services to be provided through the use of telemedicine in my medical care

## 2020-11-15 NOTE — Telephone Encounter (Signed)
°*  STAT* If patient is at the pharmacy, call can be transferred to refill team.   1. Which medications need to be refilled? (please list name of each medication and dose if known)   CARVEDILOL 6.25 MG PO BID LISINOPRIL 10 MG PO Q D   2. Which pharmacy/location (including street and city if local pharmacy) is medication to be sent to?  [facility]  3. Do they need a 30 day or 90 day supply? 90

## 2020-11-17 ENCOUNTER — Telehealth (INDEPENDENT_AMBULATORY_CARE_PROVIDER_SITE_OTHER): Payer: Medicare Other | Admitting: Physician Assistant

## 2020-11-17 ENCOUNTER — Other Ambulatory Visit: Payer: Self-pay

## 2020-11-17 DIAGNOSIS — I5022 Chronic systolic (congestive) heart failure: Secondary | ICD-10-CM

## 2020-11-17 NOTE — Progress Notes (Signed)
Unable to reach patient after several telephone attempts today.

## 2020-11-20 ENCOUNTER — Encounter: Payer: Self-pay | Admitting: Physician Assistant

## 2020-12-18 ENCOUNTER — Other Ambulatory Visit: Payer: Self-pay | Admitting: Cardiovascular Disease

## 2020-12-18 NOTE — Telephone Encounter (Signed)
Please reschedule F/U appointment. Patient did not show for last visit virtually. Will need appointment for medication refills. Thank you!

## 2020-12-19 ENCOUNTER — Other Ambulatory Visit: Payer: Self-pay | Admitting: Cardiovascular Disease

## 2020-12-19 ENCOUNTER — Telehealth: Payer: Self-pay | Admitting: Family

## 2020-12-19 NOTE — Telephone Encounter (Signed)
LVM for patient to call back and schedule

## 2020-12-19 NOTE — Telephone Encounter (Signed)
  Patient Consent for Virtual Visit         Jamie Gillespie has provided verbal consent on 12/19/2020 for a virtual visit (video or telephone).   CONSENT FOR VIRTUAL VISIT FOR:  Jamie Gillespie  By participating in this virtual visit I agree to the following:  I hereby voluntarily request, consent and authorize [facility] and its employed or contracted physicians, physician assistants, nurse practitioners or other licensed health care professionals (the Practitioner), to provide me with telemedicine health care services (the "Services") as deemed necessary by the treating Practitioner. I acknowledge and consent to receive the Services by the Practitioner via telemedicine. I understand that the telemedicine visit will involve communicating with the Practitioner through live audiovisual communication technology and the disclosure of certain medical information by electronic transmission. I acknowledge that I have been given the opportunity to request an in-person assessment or other available alternative prior to the telemedicine visit and am voluntarily participating in the telemedicine visit.  I understand that I have the right to withhold or withdraw my consent to the use of telemedicine in the course of my care at any time, without affecting my right to future care or treatment, and that the Practitioner or I may terminate the telemedicine visit at any time. I understand that I have the right to inspect all information obtained and/or recorded in the course of the telemedicine visit and may receive copies of available information for a reasonable fee.  I understand that some of the potential risks of receiving the Services via telemedicine include:  Marland Kitchen Delay or interruption in medical evaluation due to technological equipment failure or disruption; . Information transmitted may not be sufficient (e.g. poor resolution of images) to allow for appropriate medical decision making by the Practitioner;  and/or  . In rare instances, security protocols could fail, causing a breach of personal health information.  Furthermore, I acknowledge that it is my responsibility to provide information about my medical history, conditions and care that is complete and accurate to the best of my ability. I acknowledge that Practitioner's advice, recommendations, and/or decision may be based on factors not within their control, such as incomplete or inaccurate data provided by me or distortions of diagnostic images or specimens that may result from electronic transmissions. I understand that the practice of medicine is not an exact science and that Practitioner makes no warranties or guarantees regarding treatment outcomes. I acknowledge that a copy of this consent can be made available to me via my patient portal [facility] MyChart), or I can request a printed copy by calling the office of [facility].    I understand that my insurance will be billed for this visit.   I have read or had this consent read to me. . I understand the contents of this consent, which adequately explains the benefits and risks of the Services being provided via telemedicine.  . I have been provided ample opportunity to ask questions regarding this consent and the Services and have had my questions answered to my satisfaction. . I give my informed consent for the services to be provided through the use of telemedicine in my medical care

## 2020-12-19 NOTE — Telephone Encounter (Signed)
Scheduled with walker on 2/24

## 2020-12-20 NOTE — Progress Notes (Signed)
Virtual Visit via Telephone Note   This visit type was conducted due to national recommendations for restrictions regarding the COVID-19 Pandemic (e.g. social distancing) in an effort to limit this patient's exposure and mitigate transmission in our community.  Due to her co-morbid illnesses, this patient is at least at moderate risk for complications without adequate follow up.  This format is felt to be most appropriate for this patient at this time.  The patient did not have access to video technology/had technical difficulties with video requiring transitioning to audio format only (telephone).  All issues noted in this document were discussed and addressed.  No physical exam could be performed with this format.  Please refer to the patient's chart for her  consent to telehealth for [facility].   Date:  12/21/2020   ID:  Jamie Gillespie, DOB 01/09/1941, MRN [id number] The patient was identified using 2 identifiers.  Patient Location: Home Provider Location: Office/Clinic  PCP:  Patient, No Pcp Per   [facility]  Cardiologist:  Julien Nordmann, MD  Advanced Practice Provider:  No care team member to display Electrophysiologist:  None   Evaluation Performed:  Follow-Up Visit  Chief Complaint:  Follow up of coronary artery disease  History of Present Illness:    Jamie Gillespie is a 80 y.o. female with previous tobacco use, HTN, coronary artery disease s/p anterior STEMI 2015 with PCI/DES to pLAD and mLAD, HLD, chronic systolic heart failure. Her LVEF by echo 06/2014 at time of infarct was 35-40% with wall motion abnormalities notable for anteroseptal akinesia, moderate LVH, grade 2 diastolic dysfunction. She has previous intolerance to Crestor with stomach irritation though tolerates Atorvastatin. She was last seen 08/25/19 via telemedicine by Dr. Mariah Milling.   Follow up via phone today. Reports doing well. She remains diligent about staying home in setting of  pandemic. Tells me she has been walking outside for exercise. Endorses eating heart healthy diet and does her own cooking, tells me she made a pot of soup today. Endorses compliance with all of her medications. Reports mild bruising with Brilinta but not bothersome and denies hematuria, melena.   Reports no shortness of breath nor dyspnea on exertion. Reports no chest pain, pressure, or tightness. No edema, orthopnea, PND. Reports no palpitations.   The patient does not have symptoms concerning for COVID-19 infection (fever, chills, cough, or new shortness of breath).    Past Medical History:  Diagnosis Date  . CAD (coronary artery disease)    a. STEMI 07/11/2014; b. cath 06/2014: LM nl, LAD occulded @ first septal perforator s/p PCI/DES pLAD &mLAD, LCx no sig dz, RCA 50-60%,   . Chronic systolic CHF (congestive heart failure) (HCC)    a. echo 07/12/14: EF 35-40%, mod LVH, AK of entire anteroseptal myocardium, GR2DD  . Tobacco abuse    Past Surgical History:  Procedure Laterality Date  . ABDOMINAL HYSTERECTOMY  1997  . CARDIAC CATHETERIZATION  06/2014  . CORONARY ANGIOPLASTY  06/2014   a. cath 06/2014: LM nl, LAD occulded @ first septal perforator s/p PCI/DES pLAD &mLAD, LCx no sig dz, RCA 50-60%,   . LEFT HEART CATHETERIZATION WITH CORONARY ANGIOGRAM N/A 07/11/2014   Procedure: LEFT HEART CATHETERIZATION WITH CORONARY ANGIOGRAM;  Surgeon: Runell Gess, MD;  Location: [facility] CATH LAB;  Service: Cardiovascular;  Laterality: N/A;     Current Meds  Medication Sig  . acetaminophen (TYLENOL) 325 MG tablet Take 325 mg by mouth every 6 (six) hours as needed for mild  pain or moderate pain.  Marland Kitchen aspirin 81 MG chewable tablet Chew 1 tablet (81 mg total) by mouth daily.  Marland Kitchen atorvastatin (LIPITOR) 40 MG tablet Take 20 mg by mouth daily.  . carvedilol (COREG) 6.25 MG tablet TAKE 1 TABLET(6.25 MG) BY MOUTH TWICE DAILY WITH A MEAL  . lisinopril (ZESTRIL) 10 MG tablet Take 1 tablet (10 mg total) by mouth daily.   . nitroGLYCERIN (NITROSTAT) 0.4 MG SL tablet Place 1 tablet (0.4 mg total) under the tongue every 5 (five) minutes x 3 doses as needed for chest pain.  . ticagrelor (BRILINTA) 60 MG TABS tablet Take 1 tablet (60 mg total) by mouth 2 (two) times daily. Please schedule appointment with Dr. Mariah Milling for refills.     Allergies:   Patient has no known allergies.   Social History   Tobacco Use  . Smoking status: Former Smoker    Packs/day: 0.50    Years: 10.00    Pack years: 5.00    Types: Cigarettes    Quit date: 07/11/2014    Years since quitting: 6.4  . Smokeless tobacco: Never Used  Substance Use Topics  . Alcohol use: Not Currently    Comment: Occasionally, on weekend  . Drug use: No     Family Hx: The patient's family history includes Diabetes in her sister.  ROS:   Please see the history of present illness.     All other systems reviewed and are negative.   Prior CV studies:   The following studies were reviewed today:  None  Labs/Other Tests and Data Reviewed:    EKG:  No ECG reviewed.  Recent Labs: No results found for requested labs within last 8760 hours.   Recent Lipid Panel Lab Results  Component Value Date/Time   CHOL 139 08/22/2014 10:12 AM   TRIG 88 08/22/2014 10:12 AM   HDL 55 08/22/2014 10:12 AM   CHOLHDL 2.5 08/22/2014 10:12 AM   CHOLHDL 4.1 07/12/2014 03:04 AM   LDLCALC 66 08/22/2014 10:12 AM   Wt Readings from Last 3 Encounters:  12/21/20 158 lb (71.7 kg)  08/25/19 172 lb (78 kg)  01/16/18 185 lb 8 oz (84.1 kg)    Objective:    Vital Signs:  BP 127/85   Pulse 79   Ht 5\' 3"  (1.6 m)   Wt 158 lb (71.7 kg)   BMI 27.99 kg/m    VITAL SIGNS:  reviewed  ASSESSMENT & PLAN:    1. CAD - No anginal symptoms. Last ECG 2019, next office visit needs to be in person for EKG for monitoring. CAD s/p DES 2015. Has been maintained on longer term DAPT Brilinta 60mg  BID, Asa 81mg  daily. Reports mild bruising which is not bothersome and prefers to  continue DAPT. Update CBC for monitoring. Additional GDMT includes Coreg, Atorvastatin, PRN Nitroglycerin.   2. HLD, LDL goal <70 - Continue Atorvastatin 20mg  daily. Due for lipid panel, CMP - will order.   3. HFrEF - Volume status difficult to ascertain as phone visit only. Denies edema, orthopnea, PND. Most recent echo 2015 with LVEF 35-40% at time of MI. GDMT includes Coreg, Lisinopril. Recommend low salt diet, <2L fluid restriction, and regular cardiovascular exercise.  4. HTN - BP well controlled. Continue current antihypertensive regimen including Coreg 6.25mg  BID, Lisinopril 10mg  QD. Update CMP for monitoring of kidney function  Time:   Today, I have spent 11 minutes with the patient with telehealth technology discussing the above problems.     Medication Adjustments/Labs and  Tests Ordered: Current medicines are reviewed at length with the patient today.  Concerns regarding medicines are outlined above.   Tests Ordered: No orders of the defined types were placed in this encounter.   Medication Changes: No orders of the defined types were placed in this encounter.   Follow Up:  In Person in 6 month(s) with Dr. Mariah Milling or APP  Signed, Alver Sorrow, NP  12/21/2020 11:10 AM    [facility]

## 2020-12-21 ENCOUNTER — Telehealth (INDEPENDENT_AMBULATORY_CARE_PROVIDER_SITE_OTHER): Payer: Medicare Other | Admitting: Family

## 2020-12-21 ENCOUNTER — Encounter: Payer: Self-pay | Admitting: Family

## 2020-12-21 ENCOUNTER — Telehealth: Payer: Self-pay

## 2020-12-21 VITALS — BP 127/85 | HR 79 | Ht 63.0 in | Wt 158.0 lb

## 2020-12-21 DIAGNOSIS — E785 Hyperlipidemia, unspecified: Secondary | ICD-10-CM

## 2020-12-21 DIAGNOSIS — I10 Essential (primary) hypertension: Secondary | ICD-10-CM | POA: Diagnosis not present

## 2020-12-21 DIAGNOSIS — I25118 Atherosclerotic heart disease of native coronary artery with other forms of angina pectoris: Secondary | ICD-10-CM | POA: Diagnosis not present

## 2020-12-21 DIAGNOSIS — I5022 Chronic systolic (congestive) heart failure: Secondary | ICD-10-CM

## 2020-12-21 NOTE — Patient Instructions (Signed)
Medication Instructions:  Continue your current medications.   The combination of Brilinta and Aspirin can cause some bruising.  If this is overall not bothersome, it is safe to continue.  If you notice significant bruising please contact our office and we can consider stopping the Brilinta.  *If you need a refill on your cardiac medications before your next appointment, please call your pharmacy*  Lab Work: Your provider recommends that you return for lab work in the next 1-2 weeks at the [facility] at your convenience.  This will allow [place] to monitor your kidney and liver function while on your medications.  It is important that we collect routine blood work to make sure these medications are well tolerated.  You will have lab work collected at [facility] at [facility]. You do not need an appointment, they are open Monday through Friday 8am - 5pm. Please be fasting for this blood work which will include CMP, CBC, lipid panel.   If you have labs (blood work) drawn today and your tests are completely normal, you will receive your results only by: Marland Kitchen MyChart Message (if you have MyChart) OR . A paper copy in the mail If you have any lab test that is abnormal or we need to change your treatment, we will call you to review the results.   Testing/Procedures: None ordered today.  We will plan to have your next follow up in person so that we can do an EKG at that time.    Follow-Up: At [facility], you and your health needs are our priority.  As part of our continuing mission to provide you with exceptional heart care, we have created designated Provider Care Teams.  These Care Teams include your primary Cardiologist (physician) and Advanced Practice Providers (APPs -  Physician Assistants and Nurse Practitioners) who all work together to provide you with the care you need, when you need it.  We recommend signing up for the patient portal called "MyChart".  Sign up information is  provided on this After Visit Summary.  MyChart is used to connect with patients for Virtual Visits (Telemedicine).  Patients are able to view lab/test results, encounter notes, upcoming appointments, etc.  Non-urgent messages can be sent to your provider as well.   To learn more about what you can do with MyChart, go to [email or web address].    Your next appointment:   6 month(s)  The format for your next appointment:   In Person  Provider:   You may see Julien Nordmann, MD or one of the following Advanced Practice Providers on your designated Care Team:    Nicolasa Ducking, NP  Eula Listen, PA-C  Marisue Ivan, PA-C  Cadence Fransico Michael, [place]  Gillian Shields, NP  Other Instructions  Heart Healthy Diet Recommendations: A low-salt diet is recommended. Meats should be grilled, baked, or boiled. Avoid fried foods. Focus on lean protein sources like fish or chicken with vegetables and fruits. The American Heart Association is a Chief Technology Officer!  American Heart Association Diet and Lifeystyle Recommendations   Exercise recommendations: The American Heart Association recommends 150 minutes of moderate intensity exercise weekly. Try 30 minutes of moderate intensity exercise 4-5 times per week. This could include walking, jogging, or swimming.

## 2020-12-21 NOTE — Telephone Encounter (Signed)
Attempted to reach pt regarding AVS overview. LDM (DPR) approved, educated on Brilinta and ASA bruising, advised on labs 1-2 weeks at [facility] (order placed) and EKG at next in person visit in 6 months. Scheduling will call to confirm appt. AVS will be mailed to pt. Advised to call clinic with any questions.

## 2021-01-25 ENCOUNTER — Other Ambulatory Visit: Payer: Self-pay | Admitting: Cardiovascular Disease

## 2021-01-25 MED ORDER — TICAGRELOR 60 MG PO TABS: 60.0000 mg | ORAL_TABLET | Freq: Two times a day (BID) | ORAL | 0 refills | Status: DC

## 2021-01-25 NOTE — Telephone Encounter (Signed)
Patient returned call, per patient she has plenty of this medication at home and does not need a refill at this time. Patient also verified she is supposed to take Atorvastatin 40 mg once a day. Before ending the call patient requested a refill on her Brilinta be sent to pharmacy on file and Lisinopril. Informed patient she has refills remaining on her Lisinopril, please contact her pharmacy and refill for Brilinta has been sent per request.

## 2021-01-25 NOTE — Telephone Encounter (Signed)
Left message for patient to call back to verify strength of Atorvastatin she is currently taking. The prescription in the chart does not match the request we received.

## 2021-05-24 ENCOUNTER — Other Ambulatory Visit: Payer: Self-pay | Admitting: Cardiovascular Disease

## 2021-05-25 ENCOUNTER — Other Ambulatory Visit: Payer: Self-pay | Admitting: Cardiovascular Disease

## 2021-05-25 NOTE — Telephone Encounter (Signed)
Medication already sent today.   BRILINTA 60 MG TABS tablet 180 tablet 0 05/25/2021    Sig: TAKE 1 TABLET(60 MG) BY MOUTH TWICE DAILY   Sent to pharmacy as: BRILINTA 60 MG Tab tablet   Notes to Pharmacy: Patient will need an appt for further refills, Thanks ! [phone]   E-Prescribing Status: Receipt confirmed by pharmacy (05/25/2021  9:05 AM EDT)     Pharmacy  [id number] - [place], [place] - [address] AT [facility] [address] [facility]

## 2021-08-23 ENCOUNTER — Other Ambulatory Visit: Payer: Self-pay | Admitting: Cardiovascular Disease

## 2021-08-23 NOTE — Telephone Encounter (Signed)
Please schedule overdue F/U appointment for refills. Thank you! 

## 2021-08-23 NOTE — Telephone Encounter (Signed)
LMOV  

## 2021-09-22 ENCOUNTER — Other Ambulatory Visit: Payer: Self-pay | Admitting: Cardiovascular Disease

## 2021-10-30 ENCOUNTER — Other Ambulatory Visit: Payer: Self-pay | Admitting: Cardiovascular Disease

## 2021-10-30 NOTE — Telephone Encounter (Signed)
Please contact pt for future appointment. Pt overdue for 6 month f/u. Pt needing refills. 

## 2021-10-30 NOTE — Telephone Encounter (Signed)
LVM to schedule appt

## 2021-11-02 ENCOUNTER — Other Ambulatory Visit: Payer: Self-pay

## 2021-11-02 ENCOUNTER — Other Ambulatory Visit: Payer: Self-pay | Admitting: Cardiovascular Disease

## 2021-11-02 NOTE — Telephone Encounter (Signed)
Attempted to schedule.  LMOV to call office.  ° °

## 2021-11-02 NOTE — Telephone Encounter (Signed)
Patient needs an appointment, she is overdue for follow up and was last seen 11/2020.

## 2021-11-02 NOTE — Telephone Encounter (Signed)
Please call to schedule follow up appointment, she is overdue. Thank you

## 2021-11-02 NOTE — Telephone Encounter (Signed)
*  STAT* If patient is at the pharmacy, call can be transferred to refill team.   1. Which medications need to be refilled? (please list name of each medication and dose if known) Brilinta 2. Which pharmacy/location (including street and city if local pharmacy) is medication to be sent to? [facility] [place]  3. Do they need a 30 day or 90 day supply? 90

## 2021-11-02 NOTE — Telephone Encounter (Signed)
Duplicate

## 2021-11-16 NOTE — Telephone Encounter (Signed)
Attempted to schedule.  LMOV to call office.  ° °

## 2021-11-26 ENCOUNTER — Other Ambulatory Visit: Payer: Self-pay | Admitting: Cardiovascular Disease

## 2021-11-26 NOTE — Telephone Encounter (Signed)
Attempted schedule

## 2021-11-27 NOTE — Telephone Encounter (Signed)
-----   Message from Festus Aloe, CMA sent at 11/26/2021  3:23 PM EST ----- Please contact patient for a follow up appointment with Dr. Mariah Milling.  Thanks, Jasmine December

## 2021-11-27 NOTE — Telephone Encounter (Signed)
Attempted to schedule . 4th attempt to contact . Closing encounter.

## 2021-11-29 ENCOUNTER — Encounter: Payer: Self-pay | Admitting: Nurse Practitioner

## 2021-11-29 ENCOUNTER — Ambulatory Visit: Payer: Medicare Other | Admitting: Nurse Practitioner

## 2021-11-29 NOTE — Progress Notes (Deleted)
Office Visit    Patient Name: Jamie Gillespie Date of Encounter: 11/29/2021  Primary Care Provider:  Patient, No Pcp Per (Inactive) Primary Cardiologist:  Ida Rogue, MD  Chief Complaint    81 year old female with a history of CAD status post anterior STEMI and LAD stenting in 2015, chronic HFrEF, ischemic cardiomyopathy, hypertension, hyperlipidemia, and prior tobacco abuse, who presents for follow-up of CAD.  Past Medical History    Past Medical History:  Diagnosis Date   CAD (coronary artery disease)    a. STEMI 07/11/2014; b. cath 06/2014: LM nl, LAD occulded @ first septal perforator s/p PCI/DES pLAD &mLAD, LCx no sig dz, RCA 50-60%,    Chronic systolic CHF (congestive heart failure) ([place])    a. echo 07/12/14: EF 35-40%, mod LVH, AK of entire anteroseptal myocardium, GR2DD   Essential hypertension    History of tobacco abuse    Hyperlipidemia LDL goal <70    Past Surgical History:  Procedure Laterality Date   [place]  06/2014   CORONARY ANGIOPLASTY  06/2014   a. cath 06/2014: LM nl, LAD occulded @ first septal perforator s/p PCI/DES pLAD &mLAD, LCx no sig dz, RCA 50-60%,    LEFT HEART CATHETERIZATION WITH CORONARY ANGIOGRAM N/A 07/11/2014   Procedure: LEFT HEART CATHETERIZATION WITH CORONARY ANGIOGRAM;  Surgeon: Lorretta Harp, MD;  Location: [facility] CATH LAB;  Service: Cardiovascular;  Laterality: N/A;   Allergies  No Known Allergies  History of Present Illness    81 year old female with above past medical history including CAD, chronic HFrEF, ischemic cardiomyopathy, hypertension, hyperlipidemia, and prior tobacco abuse.  She suffered an anterior STEMI in 2015 and underwent PCI and drug-eluting stent placement to the proximal and mid LAD.  EF was 35 to 40% by echo and she has been managed medically.  She was intolerant to rosuvastatin but has been tolerating atorvastatin.  Jamie Gillespie was last seen via telemedicine visit in  February 2022.  She was doing well from a cardiac standpoint at that time and was advised to have follow-up lipids and complete metabolic panel, which she has not had.  Home Medications    Current Outpatient Medications  Medication Sig Dispense Refill   acetaminophen (TYLENOL) 325 MG tablet Take 325 mg by mouth every 6 (six) hours as needed for mild pain or moderate pain.     aspirin 81 MG chewable tablet Chew 1 tablet (81 mg total) by mouth daily.     atorvastatin (LIPITOR) 40 MG tablet Take 20 mg by mouth daily.     BRILINTA 60 MG TABS tablet TAKE 1 TABLET(60 MG) BY MOUTH TWICE DAILY 30 tablet 0   carvedilol (COREG) 6.25 MG tablet TAKE 1 TABLET(6.25 MG) BY MOUTH TWICE DAILY WITH A MEAL 60 tablet 9   lisinopril (ZESTRIL) 10 MG tablet TAKE 1 TABLET(10 MG) BY MOUTH DAILY 30 tablet 0   nitroGLYCERIN (NITROSTAT) 0.4 MG SL tablet Place 1 tablet (0.4 mg total) under the tongue every 5 (five) minutes x 3 doses as needed for chest pain. 25 tablet 2   No current facility-administered medications for this visit.     Review of Systems    ***.  All other systems reviewed and are otherwise negative except as noted above.    Physical Exam    VS:  There were no vitals taken for this visit. , BMI There is no height or weight on file to calculate BMI.     GEN: Well nourished,  well developed, in no acute distress. HEENT: normal. Neck: Supple, no JVD, carotid bruits, or masses. Cardiac: RRR, no murmurs, rubs, or gallops. No clubbing, cyanosis, edema.  Radials/DP/PT 2+ and equal bilaterally.  Respiratory:  Respirations regular and unlabored, clear to auscultation bilaterally. GI: Soft, nontender, nondistended, BS + x 4. MS: no deformity or atrophy. Skin: warm and dry, no rash. Neuro:  Strength and sensation are intact. Psych: Normal affect.  Accessory Clinical Findings    ECG personally reviewed by me today - *** - no acute changes.  Lab Results  Component Value Date   WBC 15.6 (H) 07/12/2014    HGB 13.2 07/12/2014   HCT 40.1 07/12/2014   MCV 79.9 07/12/2014   PLT 408 (H) 07/12/2014   Lab Results  Component Value Date   CREATININE 0.58 07/12/2014   BUN 8 07/12/2014   NA 139 07/12/2014   K 3.9 07/12/2014   CL 102 07/12/2014   CO2 21 07/12/2014   Lab Results  Component Value Date   ALT 14 08/22/2014   AST 20 08/22/2014   ALKPHOS 145 (H) 08/22/2014   BILITOT 0.5 08/22/2014   Lab Results  Component Value Date   CHOL 139 08/22/2014   HDL 55 08/22/2014   LDLCALC 66 08/22/2014   TRIG 88 08/22/2014   CHOLHDL 2.5 08/22/2014    Lab Results  Component Value Date   HGBA1C 6.1 (H) 07/12/2014    Assessment & Plan    1.  ***   Murray Hodgkins, NP 11/29/2021, 1:56 PM

## 2021-12-03 ENCOUNTER — Telehealth: Payer: Self-pay | Admitting: Cardiovascular Disease

## 2021-12-03 NOTE — Telephone Encounter (Signed)
Attempted to schedule   Patient currently visiting sister at hospital and unable to coordinate an appt at this time.

## 2021-12-03 NOTE — Telephone Encounter (Signed)
° °*  STAT* If patient is at the pharmacy, call can be transferred to refill team.   1. Which medications need to be refilled? (please list name of each medication and dose if known) brilinta 60 mg po BID  2. Which pharmacy/location (including street and city if local pharmacy) is medication to be sent to? [facility] [place]   3. Do they need a 30 day or 90 day supply? 90

## 2021-12-03 NOTE — Telephone Encounter (Signed)
Overdue for F/U. Please schedule for further refills. Thank you!

## 2022-01-09 ENCOUNTER — Other Ambulatory Visit: Payer: Self-pay | Admitting: Cardiovascular Disease

## 2022-01-09 NOTE — Telephone Encounter (Signed)
Patient needs an appt.

## 2022-01-10 NOTE — Telephone Encounter (Signed)
LMOV  

## 2022-01-16 ENCOUNTER — Other Ambulatory Visit: Payer: Self-pay | Admitting: Cardiovascular Disease

## 2022-01-16 DIAGNOSIS — Z20822 Contact with and (suspected) exposure to covid-19: Secondary | ICD-10-CM | POA: Diagnosis not present

## 2022-01-16 NOTE — Telephone Encounter (Signed)
Please schedule overdue office visit for refills. Thank you! 

## 2022-01-16 NOTE — Telephone Encounter (Signed)
LVM to schedule

## 2022-02-01 DIAGNOSIS — Z20822 Contact with and (suspected) exposure to covid-19: Secondary | ICD-10-CM | POA: Diagnosis not present

## 2022-02-03 ENCOUNTER — Other Ambulatory Visit: Payer: Self-pay | Admitting: Cardiovascular Disease

## 2022-02-04 NOTE — Telephone Encounter (Signed)
Please schedule overdue F/U appointment. Thank you! ?

## 2022-02-04 NOTE — Telephone Encounter (Signed)
Attempted to schedule.  

## 2022-02-04 NOTE — Telephone Encounter (Signed)
Please contact pt for future appointment. Pt overdue for 6 month f/u. Pt needing refills. 

## 2022-02-04 NOTE — Telephone Encounter (Signed)
Attempted to schedule.  LMOV to call office.  ° °

## 2022-02-08 NOTE — Telephone Encounter (Signed)
Ham, Pilar A  Cv Div [facility] Scheduling 3 weeks ago  ? ?PH ?LVM to schedule  ?  ? ?

## 2022-02-08 NOTE — Telephone Encounter (Signed)
3 attempts to schedule fu appt from recall list.   Deleting recall.  ? ? ?(Several duplicate msgs in chart re different refills and scheduling.)  ?

## 2022-02-08 NOTE — Telephone Encounter (Signed)
1 hour ago (8:45 AM)  ? ?JH ?Attempted to schedule.  LMOV to call office.  ?   ?  ? ?

## 2022-02-08 NOTE — Telephone Encounter (Signed)
Attempted to schedule.  LMOV to call office.  ° °

## 2022-02-21 NOTE — Telephone Encounter (Signed)
Please review for a refill on Brilinta 60 mg. The patient has been contacted x 4 attempts to contact our office for a follow up and to discuss the Brilinta refill.  ?Please advise if okay to refill.  ?

## 2022-03-04 DIAGNOSIS — Z20822 Contact with and (suspected) exposure to covid-19: Secondary | ICD-10-CM | POA: Diagnosis not present

## 2022-05-10 ENCOUNTER — Telehealth: Payer: Self-pay | Admitting: Cardiovascular Disease

## 2022-05-10 NOTE — Telephone Encounter (Signed)
3 attempts to schedule fu appt from recall list.   Deleting recall.
